# Patient Record
Sex: Male | Born: 1990 | Race: White | Hispanic: No | Marital: Single | Smoking: Current some day smoker
Health system: Southern US, Community
[De-identification: ages and names within clinical notes are randomized; demographics above are authoritative.]

## PROBLEM LIST (undated history)

## (undated) DIAGNOSIS — F1910 Other psychoactive substance abuse, uncomplicated: Secondary | ICD-10-CM

## (undated) DIAGNOSIS — F909 Attention-deficit hyperactivity disorder, unspecified type: Secondary | ICD-10-CM

## (undated) HISTORY — PX: KNEE SURGERY: SHX244

## (undated) HISTORY — DX: Attention-deficit hyperactivity disorder, unspecified type: F90.9

## (undated) HISTORY — PX: TYMPANOSTOMY TUBE PLACEMENT: SHX32

## (undated) HISTORY — PX: FEMUR FRACTURE SURGERY: SHX633

---
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
## 2011-05-09 ENCOUNTER — Ambulatory Visit: Payer: Self-pay | Admitting: Family Medicine

## 2012-05-12 ENCOUNTER — Ambulatory Visit: Payer: Self-pay | Admitting: Internal Medicine

## 2013-12-05 ENCOUNTER — Ambulatory Visit: Payer: Self-pay | Admitting: Unknown Physician Specialty

## 2014-06-01 ENCOUNTER — Emergency Department: Payer: Self-pay | Admitting: Emergency Medicine

## 2014-06-01 LAB — CBC
HCT: 41.9 % (ref 40.0–52.0)
HGB: 13.9 g/dL (ref 13.0–18.0)
MCH: 30.9 pg (ref 26.0–34.0)
MCHC: 33.3 g/dL (ref 32.0–36.0)
MCV: 93 fL (ref 80–100)
Platelet: 280 10*3/uL (ref 150–440)
RBC: 4.52 10*6/uL (ref 4.40–5.90)
RDW: 12.4 % (ref 11.5–14.5)
WBC: 8.4 10*3/uL (ref 3.8–10.6)

## 2014-06-01 LAB — COMPREHENSIVE METABOLIC PANEL
Albumin: 4.5 g/dL (ref 3.4–5.0)
Alkaline Phosphatase: 48 U/L
Anion Gap: 13 (ref 7–16)
BILIRUBIN TOTAL: 0.5 mg/dL (ref 0.2–1.0)
BUN: 17 mg/dL (ref 7–18)
CALCIUM: 9.3 mg/dL (ref 8.5–10.1)
CHLORIDE: 104 mmol/L (ref 98–107)
CO2: 23 mmol/L (ref 21–32)
CREATININE: 1.04 mg/dL (ref 0.60–1.30)
EGFR (African American): >60
GLUCOSE: 104 mg/dL — AB (ref 65–99)
OSMOLALITY: 281 (ref 275–301)
POTASSIUM: 3.4 mmol/L — AB (ref 3.5–5.1)
SGOT(AST): 17 U/L (ref 15–37)
SGPT (ALT): 22 U/L
Sodium: 140 mmol/L (ref 136–145)
Total Protein: 8 g/dL (ref 6.4–8.2)

## 2014-06-01 LAB — TROPONIN I

## 2014-06-03 ENCOUNTER — Ambulatory Visit: Payer: Self-pay | Admitting: Orthopedic Surgery

## 2014-09-01 ENCOUNTER — Ambulatory Visit: Payer: Self-pay

## 2014-09-01 LAB — GC/CHLAMYDIA PROBE AMP

## 2014-09-27 ENCOUNTER — Emergency Department: Payer: Self-pay | Admitting: Emergency Medicine

## 2014-09-27 LAB — DRUG SCREEN, URINE

## 2014-09-27 LAB — CBC
HCT: 45.4 % (ref 40.0–52.0)
HGB: 15.6 g/dL (ref 13.0–18.0)
MCH: 32 pg (ref 26.0–34.0)
MCHC: 34.5 g/dL (ref 32.0–36.0)
MCV: 93 fL (ref 80–100)
Platelet: 241 10*3/uL (ref 150–440)
RBC: 4.89 10*6/uL (ref 4.40–5.90)
RDW: 12.7 % (ref 11.5–14.5)
WBC: 7.8 10*3/uL (ref 3.8–10.6)

## 2014-09-27 LAB — COMPREHENSIVE METABOLIC PANEL
ALT: 28 U/L
AST: 33 U/L (ref 15–37)
Albumin: 4.8 g/dL (ref 3.4–5.0)
Alkaline Phosphatase: 52 U/L
Anion Gap: 12 (ref 7–16)
BILIRUBIN TOTAL: 0.7 mg/dL (ref 0.2–1.0)
BUN: 17 mg/dL (ref 7–18)
CHLORIDE: 99 mmol/L (ref 98–107)
CO2: 25 mmol/L (ref 21–32)
CREATININE: 1.1 mg/dL (ref 0.60–1.30)
Calcium, Total: 9.7 mg/dL (ref 8.5–10.1)
Glucose: 117 mg/dL — ABNORMAL HIGH (ref 65–99)
Osmolality: 275 (ref 275–301)
Potassium: 3 mmol/L — ABNORMAL LOW (ref 3.5–5.1)
Sodium: 136 mmol/L (ref 136–145)
Total Protein: 8.5 g/dL — ABNORMAL HIGH (ref 6.4–8.2)

## 2014-09-27 LAB — URINALYSIS, COMPLETE
BACTERIA: NONE SEEN
Bilirubin,UR: NEGATIVE
Blood: NEGATIVE
GLUCOSE, UR: NEGATIVE mg/dL (ref 0–75)
KETONE: NEGATIVE
Leukocyte Esterase: NEGATIVE
Nitrite: NEGATIVE
PROTEIN: NEGATIVE
Ph: 8 (ref 4.5–8.0)
RBC,UR: <1 /HPF (ref 0–5)
SPECIFIC GRAVITY: 1.001 (ref 1.003–1.030)
SQUAMOUS EPITHELIAL: NONE SEEN

## 2014-09-27 LAB — ETHANOL: Ethanol: <3 mg/dL

## 2014-09-27 LAB — SALICYLATE LEVEL

## 2014-09-27 LAB — ACETAMINOPHEN LEVEL: Acetaminophen: <2 ug/mL

## 2015-02-27 ENCOUNTER — Encounter: Payer: Self-pay | Admitting: Family Medicine

## 2015-02-27 ENCOUNTER — Ambulatory Visit (INDEPENDENT_AMBULATORY_CARE_PROVIDER_SITE_OTHER): Payer: BLUE CROSS/BLUE SHIELD | Admitting: Family Medicine

## 2015-02-27 VITALS — BP 120/90 | HR 64 | Ht 74.0 in | Wt 208.0 lb

## 2015-02-27 DIAGNOSIS — S060X0D Concussion without loss of consciousness, subsequent encounter: Secondary | ICD-10-CM

## 2015-02-27 DIAGNOSIS — S40812A Abrasion of left upper arm, initial encounter: Secondary | ICD-10-CM

## 2015-02-27 DIAGNOSIS — L259 Unspecified contact dermatitis, unspecified cause: Secondary | ICD-10-CM | POA: Diagnosis not present

## 2015-02-27 MED ORDER — PREDNISONE 10 MG PO TABS: 10.0000 mg | ORAL_TABLET | Freq: Every day | ORAL | Status: DC
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
## 2015-03-25 ENCOUNTER — Other Ambulatory Visit: Payer: Self-pay | Admitting: Family Medicine

## 2015-03-25 DIAGNOSIS — F32A Depression, unspecified: Secondary | ICD-10-CM

## 2015-03-25 DIAGNOSIS — F329 Major depressive disorder, single episode, unspecified: Secondary | ICD-10-CM

## 2015-04-21 ENCOUNTER — Other Ambulatory Visit: Payer: Self-pay

## 2016-03-07 ENCOUNTER — Ambulatory Visit: Payer: BLUE CROSS/BLUE SHIELD | Attending: Orthopedic Surgery | Admitting: Occupational Therapy

## 2016-03-07 DIAGNOSIS — X58XXXA Exposure to other specified factors, initial encounter: Secondary | ICD-10-CM | POA: Insufficient documentation

## 2016-03-07 DIAGNOSIS — S62609A Fracture of unspecified phalanx of unspecified finger, initial encounter for closed fracture: Secondary | ICD-10-CM | POA: Diagnosis present

## 2016-03-07 NOTE — Therapy (Signed)
[place] [facility] [address] [place], [place], [address] Phone: [phone]   Fax:  [phone]  Occupational Therapy Treatment  Patient Details  Name: Roger Daugherty MRN: [id number] Date of Birth: May 26, 1991 Referring Provider: Floyce StakesGaines  Encounter Date: 03/07/2016      OT End of Session - 03/07/16 0852    Visit Number 1   Number of Visits 1   Date for OT Re-Evaluation 03/07/16   OT Start Time 0731   OT Stop Time 0802   OT Time Calculation (min) 31 min   Activity Tolerance Patient tolerated treatment well   Behavior During Therapy [facility] for tasks assessed/performed      Past Medical History  Diagnosis Date  . Adult ADHD     Past Surgical History  Procedure Laterality Date  . Knee surgery Right     Meniscal tear  . Tympanostomy tube placement      There were no vitals filed for this visit.      Subjective Assessment - 03/07/16 0800    Subjective  I fracture my base of R 5th MC- probably did it about 2 wks before talking to the PA - he send me over here for splint - I am running now - can't do crossfit    Patient Stated Goals To get splint on this hand for fracture to heal   Currently in Pain? 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 -  [employer], run long distance trails,  golf, obstacle course races      See pt instructions                    OT Education - 03/07/16 0808    Education provided Yes   Education Details splint wearing     Person(s) Educated Patient   Methods Explanation;Demonstration;Tactile cues;Verbal cues   Comprehension Verbal cues required;Returned demonstration;Verbalized understanding             OT Long Term Goals - 03/07/16 0855    OT LONG TERM GOAL #1   Title Pt verbalize and demo understanding of wearing , donning and doffing of splint    Baseline Showed in clinici   Status Achieved               Plan - 03/07/16 0853    Clinical Impression Statement Pt report about 3wks out from R 5th MC base fx - per pt MD wants him in ulnar gutter splint- fabricated forearm base ulnar gutter splint - including 4th and 5th - but PIP's of 4th free - and MC's flex at 80 - pt report understanding  wearing of splint - appear to have good fit - pt to wear as per MD order - phone if any  redness or pressure areas thatr do not disappear in 45 min    Rehab Potential Excellent  OT Frequency One time visit   OT Treatment/Interventions Patient/family education;Splinting   Plan to phone if issues with splnt - other wise 1 x visit   OT Home Exercise Plan see pt instruction    Consulted and Agree with Plan of Care Patient      Patient will benefit from skilled therapeutic intervention in order to improve the following deficits and impairments:  Impaired flexibility, Pain  Visit Diagnosis: Finger fracture, right, closed, initial encounter - Plan: Ot plan of care cert/re-cert    Problem List There are no active problems to display for this patient.   Oletta CohnuPreez, Lydell Moga OTR/L,CLT  03/07/2016, 8:58 AM  [place] [facility] [address] [place], [place], [address] Phone: [phone]   Fax:  [phone]  Name: Roger Daugherty MRN: [id number] Date of Birth: 02-23-1991

## 2016-03-07 NOTE — Patient Instructions (Signed)
Custom forearm base ulnar gutter splint - including 5th and 4th  MC's at 80 degrees flexion  PIP 4th free  To keep splint on as per MD order - and do ed on redness or pressure areas   To call

## 2016-03-24 ENCOUNTER — Emergency Department
Admission: EM | Admit: 2016-03-24 | Discharge: 2016-03-24 | Disposition: A | Discharge status: Home / Self Care | Payer: BLUE CROSS/BLUE SHIELD | Attending: Emergency Medicine | Admitting: Emergency Medicine

## 2016-03-24 ENCOUNTER — Encounter: Payer: Self-pay | Admitting: Emergency Medicine

## 2016-03-24 DIAGNOSIS — F909 Attention-deficit hyperactivity disorder, unspecified type: Secondary | ICD-10-CM | POA: Diagnosis not present

## 2016-03-24 DIAGNOSIS — F1910 Other psychoactive substance abuse, uncomplicated: Secondary | ICD-10-CM

## 2016-03-24 DIAGNOSIS — Z87891 Personal history of nicotine dependence: Secondary | ICD-10-CM | POA: Diagnosis not present

## 2016-03-24 DIAGNOSIS — Z79899 Other long term (current) drug therapy: Secondary | ICD-10-CM | POA: Diagnosis not present

## 2016-03-24 DIAGNOSIS — F1290 Cannabis use, unspecified, uncomplicated: Secondary | ICD-10-CM | POA: Diagnosis not present

## 2016-03-24 DIAGNOSIS — R45850 Homicidal ideations: Secondary | ICD-10-CM | POA: Diagnosis present

## 2016-03-24 DIAGNOSIS — F1990 Other psychoactive substance use, unspecified, uncomplicated: Secondary | ICD-10-CM | POA: Diagnosis not present

## 2016-03-24 LAB — CBC
HEMATOCRIT: 42.8 % (ref 40.0–52.0)
Hemoglobin: 14.8 g/dL (ref 13.0–18.0)
MCH: 32.5 pg (ref 26.0–34.0)
MCHC: 34.6 g/dL (ref 32.0–36.0)
MCV: 93.9 fL (ref 80.0–100.0)
PLATELETS: 220 10*3/uL (ref 150–440)
RBC: 4.56 MIL/uL (ref 4.40–5.90)
RDW: 13 % (ref 11.5–14.5)
WBC: 6.9 10*3/uL (ref 3.8–10.6)

## 2016-03-24 LAB — COMPREHENSIVE METABOLIC PANEL
ALT: 39 U/L (ref 17–63)
AST: 41 U/L (ref 15–41)
Albumin: 5.1 g/dL — ABNORMAL HIGH (ref 3.5–5.0)
Alkaline Phosphatase: 50 U/L (ref 38–126)
Anion gap: 7 (ref 5–15)
BUN: 19 mg/dL (ref 6–20)
CHLORIDE: 106 mmol/L (ref 101–111)
CO2: 26 mmol/L (ref 22–32)
CREATININE: 0.88 mg/dL (ref 0.61–1.24)
Calcium: 9.8 mg/dL (ref 8.9–10.3)
Glucose, Bld: 104 mg/dL — ABNORMAL HIGH (ref 65–99)
Potassium: 4.4 mmol/L (ref 3.5–5.1)
Sodium: 139 mmol/L (ref 135–145)
TOTAL PROTEIN: 7.8 g/dL (ref 6.5–8.1)
Total Bilirubin: 0.6 mg/dL (ref 0.3–1.2)

## 2016-03-24 LAB — ETHANOL

## 2016-03-24 LAB — SALICYLATE LEVEL

## 2016-03-24 LAB — ACETAMINOPHEN LEVEL

## 2016-03-24 MED ORDER — LORAZEPAM 2 MG PO TABS: 2.0000 mg | ORAL_TABLET | Freq: Once | ORAL | Status: DC

## 2016-03-24 NOTE — ED Notes (Signed)
Brought over from out patient psy eval, pt appears to have some type of impairment,  Took 5 klonopins today , ETOH nightly, Psy Dr.Patel  " possible bipolar disorder" ,  Denies SI
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
## 2016-03-26 ENCOUNTER — Emergency Department
Admission: EM | Admit: 2016-03-26 | Discharge: 2016-03-26 | Disposition: A | Discharge status: Home / Self Care | Payer: BLUE CROSS/BLUE SHIELD | Attending: Emergency Medicine | Admitting: Emergency Medicine

## 2016-03-26 DIAGNOSIS — Z79899 Other long term (current) drug therapy: Secondary | ICD-10-CM | POA: Insufficient documentation

## 2016-03-26 DIAGNOSIS — F1994 Other psychoactive substance use, unspecified with psychoactive substance-induced mood disorder: Secondary | ICD-10-CM | POA: Diagnosis not present

## 2016-03-26 DIAGNOSIS — Z9622 Myringotomy tube(s) status: Secondary | ICD-10-CM | POA: Insufficient documentation

## 2016-03-26 DIAGNOSIS — F900 Attention-deficit hyperactivity disorder, predominantly inattentive type: Secondary | ICD-10-CM | POA: Insufficient documentation

## 2016-03-26 DIAGNOSIS — F1320 Sedative, hypnotic or anxiolytic dependence, uncomplicated: Secondary | ICD-10-CM

## 2016-03-26 DIAGNOSIS — F1910 Other psychoactive substance abuse, uncomplicated: Secondary | ICD-10-CM | POA: Insufficient documentation

## 2016-03-26 DIAGNOSIS — F1310 Sedative, hypnotic or anxiolytic abuse, uncomplicated: Secondary | ICD-10-CM

## 2016-03-26 DIAGNOSIS — F1290 Cannabis use, unspecified, uncomplicated: Secondary | ICD-10-CM | POA: Diagnosis not present

## 2016-03-26 DIAGNOSIS — Z87891 Personal history of nicotine dependence: Secondary | ICD-10-CM | POA: Diagnosis not present

## 2016-03-26 LAB — URINE DRUG SCREEN, QUALITATIVE (ARMC ONLY)
AMPHETAMINES, UR SCREEN: NOT DETECTED
Barbiturates, Ur Screen: NOT DETECTED
Benzodiazepine, Ur Scrn: POSITIVE — AB
COCAINE METABOLITE, UR ~~LOC~~: NOT DETECTED
Cannabinoid 50 Ng, Ur ~~LOC~~: POSITIVE — AB
MDMA (ECSTASY) UR SCREEN: NOT DETECTED
Methadone Scn, Ur: NOT DETECTED
Opiate, Ur Screen: NOT DETECTED
PHENCYCLIDINE (PCP) UR S: NOT DETECTED
TRICYCLIC, UR SCREEN: NOT DETECTED

## 2016-03-26 LAB — COMPREHENSIVE METABOLIC PANEL
ALT: 31 U/L (ref 17–63)
ANION GAP: 5 (ref 5–15)
AST: 32 U/L (ref 15–41)
Albumin: 4.5 g/dL (ref 3.5–5.0)
Alkaline Phosphatase: 35 U/L — ABNORMAL LOW (ref 38–126)
BILIRUBIN TOTAL: 0.8 mg/dL (ref 0.3–1.2)
BUN: 18 mg/dL (ref 6–20)
CHLORIDE: 105 mmol/L (ref 101–111)
CO2: 30 mmol/L (ref 22–32)
Calcium: 9.1 mg/dL (ref 8.9–10.3)
Creatinine, Ser: 1.02 mg/dL (ref 0.61–1.24)
Glucose, Bld: 52 mg/dL — ABNORMAL LOW (ref 65–99)
POTASSIUM: 3.9 mmol/L (ref 3.5–5.1)
Sodium: 140 mmol/L (ref 135–145)
TOTAL PROTEIN: 7 g/dL (ref 6.5–8.1)

## 2016-03-26 LAB — URINALYSIS COMPLETE WITH MICROSCOPIC (ARMC ONLY)
Bacteria, UA: NONE SEEN
Bilirubin Urine: NEGATIVE
GLUCOSE, UA: NEGATIVE mg/dL
Hgb urine dipstick: NEGATIVE
KETONES UR: NEGATIVE mg/dL
Leukocytes, UA: NEGATIVE
NITRITE: NEGATIVE
PROTEIN: NEGATIVE mg/dL
RBC / HPF: NONE SEEN RBC/hpf (ref 0–5)
SPECIFIC GRAVITY, URINE: 1.017 (ref 1.005–1.030)
Squamous Epithelial / LPF: NONE SEEN
pH: 6 (ref 5.0–8.0)

## 2016-03-26 LAB — SALICYLATE LEVEL

## 2016-03-26 LAB — CBC WITH DIFFERENTIAL/PLATELET
BASOS ABS: 0 10*3/uL (ref 0–0.1)
Basophils Relative: 1 %
EOS PCT: 3 %
Eosinophils Absolute: 0.2 10*3/uL (ref 0–0.7)
HEMATOCRIT: 40.2 % (ref 40.0–52.0)
Hemoglobin: 13.8 g/dL (ref 13.0–18.0)
LYMPHS PCT: 41 %
Lymphs Abs: 3 10*3/uL (ref 1.0–3.6)
MCH: 32.3 pg (ref 26.0–34.0)
MCHC: 34.3 g/dL (ref 32.0–36.0)
MCV: 94.1 fL (ref 80.0–100.0)
MONO ABS: 0.6 10*3/uL (ref 0.2–1.0)
MONOS PCT: 8 %
NEUTROS ABS: 3.5 10*3/uL (ref 1.4–6.5)
Neutrophils Relative %: 47 %
PLATELETS: 196 10*3/uL (ref 150–440)
RBC: 4.27 MIL/uL — ABNORMAL LOW (ref 4.40–5.90)
RDW: 13.2 % (ref 11.5–14.5)
WBC: 7.4 10*3/uL (ref 3.8–10.6)

## 2016-03-26 LAB — GLUCOSE, CAPILLARY: GLUCOSE-CAPILLARY: 102 mg/dL — AB (ref 65–99)

## 2016-03-26 LAB — ETHANOL: ALCOHOL ETHYL (B): 47 mg/dL — AB (ref <5)

## 2016-03-26 LAB — ACETAMINOPHEN LEVEL: Acetaminophen (Tylenol), Serum: <10 ug/mL — ABNORMAL LOW (ref 10–30)

## 2016-03-26 MED ORDER — LORAZEPAM 2 MG PO TABS: 0.0000 mg | ORAL_TABLET | Freq: Two times a day (BID) | ORAL | Status: DC

## 2016-03-26 MED ORDER — THIAMINE HCL 100 MG/ML IJ SOLN: 100.0000 mg | Freq: Every day | INTRAMUSCULAR | Status: DC

## 2016-03-26 MED ORDER — LORAZEPAM 1 MG PO TABS
ORAL_TABLET | ORAL | Status: AC
  Administered 2016-03-26: 2 mg via ORAL
  Filled 2016-03-26: qty 1

## 2016-03-26 MED ORDER — VITAMIN B-1 100 MG PO TABS
100.0000 mg | ORAL_TABLET | Freq: Every day | ORAL | Status: DC
  Administered 2016-03-26: 100 mg via ORAL
  Filled 2016-03-26: qty 1

## 2016-03-26 MED ORDER — LORAZEPAM 2 MG PO TABS
0.0000 mg | ORAL_TABLET | Freq: Four times a day (QID) | ORAL | Status: DC
  Administered 2016-03-26: 2 mg via ORAL
  Filled 2016-03-26: qty 1

## 2016-03-26 NOTE — ED Notes (Signed)
Lights dimmed for comfort and bed adjusted for comfort.

## 2016-03-26 NOTE — ED Notes (Signed)
Ford with TTS informed this rn that "our psychiatrist is recommending inpatient treatment". Dr. York Ceriseforbach notified.

## 2016-03-26 NOTE — ED Notes (Signed)
Patient currently in dayroom speaking with nursing staff. Patient has asked several times when he will be able to be discharged, and nurse informed patient of the discharge process. No signs of distress noted at this time. Maintained on 15 minute checks and observation by security camera for safety.

## 2016-03-26 NOTE — Consult Note (Signed)
[facility] Psychiatry Consult   Reason for Consult:  Consult for 25 year old man who came voluntarily to the emergency room requesting treatment for benzodiazepine abuse Referring Physician:  Jimmye Norman Patient Identification: Roger Daugherty MRN:  [id number] Principal Diagnosis: Substance induced mood disorder [facility]) Diagnosis:   Patient Active Problem List   Diagnosis Date Noted  . Benzodiazepine dependence ([place]) [F13.20] 03/26/2016  . Substance induced mood disorder [facility]) [F19.94] 03/26/2016    Total Time spent with patient: 1 hour  Subjective:   Roger Daugherty is a 25 y.o. male patient admitted with "I just wanted some help with my benzodiazepine abuse".  HPI:  Patient evaluated. Chart reviewed. Patient presented to the ER last night because of benzodiazepine abuse. He tells me that he takes about 14 mg of clonazepam a day and that he will take Xanax 2 if it is available. He also says that he was drinking a few beers yesterday. He felt more sedated than usual and that what he says was why he was worried enough to come to the emergency room. Patient denies any suicidal thoughts or intent. Denies homicidal ideation. Denies any psychotic symptoms. He is not currently involved in any kind of outpatient mental health treatment. He says he continues to function reasonably well at work.  Social history: Lives with his parents. Works as a Physiological scientist.  Medical history: Has irritable bowel syndrome no other active medical problems.  Substance abuse history: Long history of abusing benzodiazepines. Has never been to any kind of substance abuse treatment program. He's been able to stop on his own for a couple weeks at most but then gets too anxious and goes back to abusing. Denies that he is abusing any other drugs.  Past Psychiatric History: No history of psychiatric hospitalization. No history of suicide attempts. Not prescribed any medicine for the anxiety despises drugs off the  street. No hallucinations no psychosis. No history of harm to others.  Risk to Self: Suicidal Ideation: No Suicidal Intent: No Is patient at risk for suicide?: No Suicidal Plan?: No Access to Means: No What has been your use of drugs/alcohol within the last 12 months?: Pt is abusing benzodiazepines, marijuana and alcohol How many times?: 0 Other Self Harm Risks: None Triggers for Past Attempts: None known Intentional Self Injurious Behavior: None Risk to Others: Homicidal Ideation: No Thoughts of Harm to Others: No Current Homicidal Intent: No Current Homicidal Plan: No Access to Homicidal Means: No Identified Victim: None History of harm to others?: No Assessment of Violence: None Noted Violent Behavior Description: Pt denies history of violence Does patient have access to weapons?: Yes (Comment) (Pt reports he has several guns) Criminal Charges Pending?: Yes Describe Pending Criminal Charges: DWI Does patient have a court date: Yes Court Date:  (Unknown) Prior Inpatient Therapy: Prior Inpatient Therapy: No Prior Therapy Dates: NA Prior Therapy Facilty/Provider(s): NA Reason for Treatment: NA Prior Outpatient Therapy: Prior Outpatient Therapy: Yes Prior Therapy Dates: 2017 Prior Therapy Facilty/Provider(s): Dr. Gwen Her and Caffie Damme Reason for Treatment: anxiety, PTSD Does patient have an ACCT team?: No Does patient have Intensive In-House Services?  : No Does patient have Daugherty services? : No Does patient have P4CC services?: No  Past Medical History:  Past Medical History  Diagnosis Date  . Adult ADHD     Past Surgical History  Procedure Laterality Date  . Knee surgery Right     Meniscal tear  . Tympanostomy tube placement     Family History: No family history  on file. Family Psychiatric  History: Patient describes his mother as "crazy as hell" and also says there is substance abuse in his family. He can't be any more clear than that about his  mother. Social History:  History  Alcohol Use  . 1.2 oz/week  . 2 Cans of beer per week    Comment: Daily     History  Drug Use  . Yes  . Special: Marijuana    Comment: Benzo    Social History   Social History  . Marital Status: Single    Spouse Name: N/A  . Number of Children: N/A  . Years of Education: N/A   Social History Main Topics  . Smoking status: Former Research scientist (life sciences)  . Smokeless tobacco: Not on file  . Alcohol Use: 1.2 oz/week    2 Cans of beer per week     Comment: Daily  . Drug Use: Yes    Special: Marijuana     Comment: Benzo  . Sexual Activity: Yes    Birth Control/ Protection: None   Other Topics Concern  . Not on file   Social History Narrative   Additional Social History:    Allergies:  No Known Allergies  Labs:  Results for orders placed or performed during the hospital encounter of 03/26/16 (from the past 48 hour(s))  CBC with Differential/Platelet     Status: Abnormal   Collection Time: 03/26/16  3:30 AM  Result Value Ref Range   WBC 7.4 3.8 - 10.6 K/uL   RBC 4.27 (L) 4.40 - 5.90 MIL/uL   Hemoglobin 13.8 13.0 - 18.0 g/dL   HCT 40.2 40.0 - 52.0 %   MCV 94.1 80.0 - 100.0 fL   MCH 32.3 26.0 - 34.0 pg   MCHC 34.3 32.0 - 36.0 g/dL   RDW 13.2 11.5 - 14.5 %   Platelets 196 150 - 440 K/uL   Neutrophils Relative % 47 %   Neutro Abs 3.5 1.4 - 6.5 K/uL   Lymphocytes Relative 41 %   Lymphs Abs 3.0 1.0 - 3.6 K/uL   Monocytes Relative 8 %   Monocytes Absolute 0.6 0.2 - 1.0 K/uL   Eosinophils Relative 3 %   Eosinophils Absolute 0.2 0 - 0.7 K/uL   Basophils Relative 1 %   Basophils Absolute 0.0 0 - 0.1 K/uL  Comprehensive metabolic panel     Status: Abnormal   Collection Time: 03/26/16  3:30 AM  Result Value Ref Range   Sodium 140 135 - 145 mmol/L   Potassium 3.9 3.5 - 5.1 mmol/L   Chloride 105 101 - 111 mmol/L   CO2 30 22 - 32 mmol/L   Glucose, Bld 52 (L) 65 - 99 mg/dL   BUN 18 6 - 20 mg/dL   Creatinine, Ser 1.02 0.61 - 1.24 mg/dL   Calcium  9.1 8.9 - 10.3 mg/dL   Total Protein 7.0 6.5 - 8.1 g/dL   Albumin 4.5 3.5 - 5.0 g/dL   AST 32 15 - 41 U/L   ALT 31 17 - 63 U/L   Alkaline Phosphatase 35 (L) 38 - 126 U/L   Total Bilirubin 0.8 0.3 - 1.2 mg/dL   GFR calc non Af Amer >60 >60 mL/min   GFR calc Af Amer >60 >60 mL/min    Comment: (NOTE) The eGFR has been calculated using the CKD EPI equation. This calculation has not been validated in all clinical situations. eGFR's persistently <60 mL/min signify possible Chronic Kidney Disease.    Anion  gap 5 5 - 15  Ethanol     Status: Abnormal   Collection Time: 03/26/16  3:30 AM  Result Value Ref Range   Alcohol, Ethyl (B) 47 (H) <5 mg/dL    Comment:        LOWEST DETECTABLE LIMIT FOR SERUM ALCOHOL IS 5 mg/dL FOR MEDICAL PURPOSES ONLY   Salicylate level     Status: None   Collection Time: 03/26/16  3:30 AM  Result Value Ref Range   Salicylate Lvl <1.6 2.8 - 30.0 mg/dL  Acetaminophen level     Status: Abnormal   Collection Time: 03/26/16  3:30 AM  Result Value Ref Range   Acetaminophen (Tylenol), Serum <10 (L) 10 - 30 ug/mL    Comment:        THERAPEUTIC CONCENTRATIONS VARY SIGNIFICANTLY. A RANGE OF 10-30 ug/mL MAY BE AN EFFECTIVE CONCENTRATION FOR MANY PATIENTS. HOWEVER, SOME ARE BEST TREATED AT CONCENTRATIONS OUTSIDE THIS RANGE. ACETAMINOPHEN CONCENTRATIONS >150 ug/mL AT 4 HOURS AFTER INGESTION AND >50 ug/mL AT 12 HOURS AFTER INGESTION ARE OFTEN ASSOCIATED WITH TOXIC REACTIONS.   Glucose, capillary     Status: Abnormal   Collection Time: 03/26/16  4:54 AM  Result Value Ref Range   Glucose-Capillary 102 (H) 65 - 99 mg/dL  Urinalysis complete, with microscopic ([facility] only)     Status: Abnormal   Collection Time: 03/26/16  6:50 AM  Result Value Ref Range   Color, Urine YELLOW (A) YELLOW   APPearance CLEAR (A) CLEAR   Glucose, UA NEGATIVE NEGATIVE mg/dL   Bilirubin Urine NEGATIVE NEGATIVE   Ketones, ur NEGATIVE NEGATIVE mg/dL   Specific Gravity, Urine 1.017  1.005 - 1.030   Hgb urine dipstick NEGATIVE NEGATIVE   pH 6.0 5.0 - 8.0   Protein, ur NEGATIVE NEGATIVE mg/dL   Nitrite NEGATIVE NEGATIVE   Leukocytes, UA NEGATIVE NEGATIVE   RBC / HPF NONE SEEN 0 - 5 RBC/hpf   WBC, UA 0-5 0 - 5 WBC/hpf   Bacteria, UA NONE SEEN NONE SEEN   Squamous Epithelial / LPF NONE SEEN NONE SEEN   Mucous PRESENT   Urine Drug Screen, Qualitative ([facility] only)     Status: Abnormal   Collection Time: 03/26/16  6:50 AM  Result Value Ref Range   Tricyclic, Ur Screen NONE DETECTED NONE DETECTED   Amphetamines, Ur Screen NONE DETECTED NONE DETECTED   MDMA (Ecstasy)Ur Screen NONE DETECTED NONE DETECTED   Cocaine Metabolite,Ur [place] NONE DETECTED NONE DETECTED   Opiate, Ur Screen NONE DETECTED NONE DETECTED   Phencyclidine (PCP) Ur S NONE DETECTED NONE DETECTED   Cannabinoid 50 Ng, Ur [place] POSITIVE (A) NONE DETECTED   Barbiturates, Ur Screen NONE DETECTED NONE DETECTED   Benzodiazepine, Ur Scrn POSITIVE (A) NONE DETECTED   Methadone Scn, Ur NONE DETECTED NONE DETECTED    Comment: (NOTE) 109  Tricyclics, urine               Cutoff 1000 ng/mL 200  Amphetamines, urine             Cutoff 1000 ng/mL 300  MDMA (Ecstasy), urine           Cutoff 500 ng/mL 400  Cocaine Metabolite, urine       Cutoff 300 ng/mL 500  Opiate, urine                   Cutoff 300 ng/mL 600  Phencyclidine (PCP), urine      Cutoff 25 ng/mL 700  Cannabinoid, urine  Cutoff 50 ng/mL 800  Barbiturates, urine             Cutoff 200 ng/mL 900  Benzodiazepine, urine           Cutoff 200 ng/mL 1000 Methadone, urine                Cutoff 300 ng/mL 1100 1200 The urine drug screen provides only a preliminary, unconfirmed 1300 analytical test result and should not be used for non-medical 1400 purposes. Clinical consideration and professional judgment should 1500 be applied to any positive drug screen result due to possible 1600 interfering substances. A more specific alternate chemical method 1700 must  be used in order to obtain a confirmed analytical result.  1800 Gas chromato graphy / mass spectrometry (GC/MS) is the preferred 1900 confirmatory method.     Current Facility-Administered Medications  Medication Dose Route Frequency Provider Last Rate Last Dose  . LORazepam (ATIVAN) tablet 0-4 mg  0-4 mg Oral Q6H Hinda Kehr, MD   2 mg at 03/26/16 1053   Followed by  . [START ON 03/28/2016] LORazepam (ATIVAN) tablet 0-4 mg  0-4 mg Oral Q12H Hinda Kehr, MD      . thiamine (VITAMIN B-1) tablet 100 mg  100 mg Oral Daily Hinda Kehr, MD   100 mg at 03/26/16 1035   Or  . thiamine (B-1) injection 100 mg  100 mg Intravenous Daily Hinda Kehr, MD       Current Outpatient Prescriptions  Medication Sig Dispense Refill  . albuterol (PROVENTIL HFA;VENTOLIN HFA) 108 (90 Base) MCG/ACT inhaler Inhale 2 puffs into the lungs See admin instructions. Every 4-6 hours as needed    . buPROPion (WELLBUTRIN SR) 150 MG 12 hr tablet Take 150 mg by mouth 2 (two) times daily.    Marland Kitchen linaclotide (LINZESS) 290 MCG CAPS capsule Take 290 mcg by mouth daily before breakfast.    . mometasone (ELOCON) 0.1 % ointment Apply topically daily as needed.    . vortioxetine HBr (TRINTELLIX) 20 MG TABS Take 10 mg by mouth daily.      Musculoskeletal: Strength & Muscle Tone: within normal limits Gait & Station: normal Patient leans: N/A  Psychiatric Specialty Exam: Physical Exam  Nursing note and vitals reviewed. Constitutional: He appears well-developed and well-nourished.  HENT:  Head: Normocephalic and atraumatic.  Eyes: Conjunctivae are normal. Pupils are equal, round, and reactive to light.  Neck: Normal range of motion.  Cardiovascular: Regular rhythm and normal heart sounds.   Respiratory: Effort normal. No respiratory distress.  GI: Soft.  Musculoskeletal: Normal range of motion.  Neurological: He is alert.  Skin: Skin is warm and dry.  Psychiatric: His behavior is normal. Thought content normal. His mood  appears anxious. Cognition and memory are normal. He expresses impulsivity.    Review of Systems  Constitutional: Negative.   HENT: Negative.   Eyes: Negative.   Respiratory: Negative.   Cardiovascular: Negative.   Gastrointestinal: Negative.   Musculoskeletal: Negative.   Skin: Negative.   Neurological: Negative.   Psychiatric/Behavioral: Positive for memory loss and substance abuse. Negative for depression, suicidal ideas and hallucinations. The patient is nervous/anxious. The patient does not have insomnia.     Blood pressure 115/65, pulse 55, temperature 97.8 F (36.6 C), temperature source Oral, resp. rate 20, height _0  (1.905 m), weight 82.555 kg (182 lb), SpO2 100 %.Body mass index is 22.75 kg/(m^2).  General Appearance: Disheveled  Eye Contact:  Good  Speech:  Pressured  Volume:  Increased  Mood:  Anxious  Affect:  Congruent  Thought Process:  Goal Directed  Orientation:  Full (Time, Place, and Person)  Thought Content:  Logical  Suicidal Thoughts:  No  Homicidal Thoughts:  No  Memory:  Immediate;   Good Recent;   Good Remote;   Good  Judgement:  Fair  Insight:  Fair  Psychomotor Activity:  Restlessness  Concentration:  Concentration: Poor  Recall:  [place] of Knowledge:  Good  Language:  Good  Akathisia:  No  Handed:  Right  AIMS (if indicated):     Assets:  Armed forces logistics/support/administrative officer Housing Physical Health  ADL's:  Intact  Cognition:  Impaired,  Mild  Sleep:        Treatment Plan Summary: Daily contact with patient to assess and evaluate symptoms and progress in treatment, Medication management and Plan 25 year old man with benzodiazepine abuse. He is currently lucid without any psychosis. Vital signs are stable. He has subjective anxiety. Patient has reasonable insight. No indication for commitment. He is requesting release. Patient will be given information about referral to local substance abuse services and strongly encouraged to get in to see either  her primary care doctor or a physician at mental health to work on a gradual taper for his medicine. No prescriptions at this time. He can be released from the emergency room. Case reviewed with the ER physician.  Disposition: Patient does not meet criteria for psychiatric inpatient admission. Supportive therapy provided about ongoing stressors.  Alethia Berthold, MD 03/26/2016 1:39 PM

## 2016-03-26 NOTE — ED Notes (Signed)
Pt sleeping, resps unlabored.  

## 2016-03-26 NOTE — ED Notes (Signed)
Patient requested to take a shower. No signs of acute distress noted. Maintained on 15 minute checks and observation by security camera for safety.

## 2016-03-26 NOTE — ED Provider Notes (Signed)
-----------------------------------------   12:47 PM on 03/26/2016 -----------------------------------------  Patient evaluated and cleared by psychiatry for discharge. Patient here for addiction to Klonopin and Xanax with no suicidal ideation.  Nita Sicklearolina Lesa Vandall, MD 03/26/16 1247

## 2016-03-26 NOTE — ED Notes (Signed)
Patient asleep in room. No noted distress or abnormal behavior. Will continue 15 minute checks and observation by security cameras for safety. 

## 2016-03-26 NOTE — ED Notes (Signed)
Patient currently denies SI/HI/AVH and pain. Patient was tearful upon assessment talking about his grief concerning the death of his best friend, and that he was missing the fundraiser used to pay for the funeral expenses. He requests medication for detox from benzos saying that he wants to make sure he doesn't have a seizure. Patient is sweaty and tremulous upon assessment and also appears to be anxious. Nurse advised patient that his withdrawal symptoms would have to continue to be monitored. Patient is calm and cooperative, no signs of acute distress at this time. Maintained on 15 minute checks and observation by security camera for safety.

## 2016-03-26 NOTE — ED Notes (Signed)
ENVIRONMENTAL ASSESSMENT Potentially harmful objects out of patient reach: Yes Personal belongings secured: Yes Patient dressed in hospital provided attire only: Yes Plastic bags out of patient reach: Yes Patient care equipment (cords, cables, call bells, lines, and drains) shortened, removed, or accounted for: Yes Equipment and supplies removed from bottom of stretcher: Yes Potentially toxic materials out of patient reach: Yes Sharps container removed or out of patient reach: Yes  Patient currently in room sleeping. No signs of distress noted at this time. Maintained on 15 minute checks and observation by security camera for safety.  

## 2016-03-26 NOTE — ED Notes (Addendum)
Pt dressed out into burgandy scrubs by matt, rn. Pt up attempting to urinate at this time. Pt needs to provide urine sample [facility]

## 2016-03-26 NOTE — ED Notes (Signed)
Pt ambulatory to triage with slightly unsteady gait and assisted by this RN for safety. Pt reports he has taken at least "14mg  of benzos" today. Pt states I want to do whatever it takes to get clean. Pt also reports daily etoh of approx 2 to 5 beers a day. Pt was seen here 2 days ago but left before treatment completed. Pt states he left because it was taking to long. Pt speech slow and "thick" sounding. Pt noted to be bradycardic with heart rate of 38 in triage.

## 2016-03-26 NOTE — ED Notes (Signed)
Pt continues to atttempt to urinate. Multiple cups of water provided.

## 2016-03-26 NOTE — ED Notes (Signed)
Patient complains of headache, stomach ache, sweats, anxiety, and tremors rated a 7/10. Patient is currently in dayroom stating that "he just doesn't feel right". Nurse administered 2 mg of ativan. Will continue to monitor patient for worsening symptoms.

## 2016-03-26 NOTE — ED Notes (Signed)
Pt ringing call bell atpprox every 8-10 minutes for small frequent requests. Pt again questioning wait time. Pt has consumed approx 80-%of sandwich, of orange juice and 4 graham crackers. Pt updated again regarding wait time and md evaluation.

## 2016-03-26 NOTE — ED Notes (Signed)
Denies SI/HI/AVH and pain. All belongings returned to patient, patient belongings form signed. Patient returned home to family. Discharge instructions reviewed.

## 2016-03-26 NOTE — ED Notes (Signed)
Report to margaret, rn in bhu.

## 2016-03-26 NOTE — BH Assessment (Addendum)
Tele Assessment Note   Roger Daugherty is an 25 y.o. single male who presents unaccompanied to [facility] ED requesting treatment for substance abuse. Pt was assessed at [facility] yesterday but left because he did not want to wait. Pt reports he started using Xanax and Klonopin in Roger 2016 and has been using 14-16 mg on a daily basis. Pt reports he buys benzodiazepines off the street. He also reports drinking 2-6 cans of beer daily and smoking approximately one gram of marijuana daily. He reports withdrawal symptoms when he tried to stop using including anxiety, irritability, tremors and sweats. He denies any history of seizures. He reports he has lost approximately thirty pounds in the past three months. He states without benzodiazepines he has insomnia. According to the Pt within the past week, his best friend just died, he lost his car, he has a DWI, his parents have separated and he is having financial difficulties. Pt denies current suicidal ideation or any history of suicide attempts. Pt denies current homicidal ideation or history of violence. Pt states he has a history of punching walls when angry. He denies any history of psychotic symptoms.  Pt reports his parents have separated and he stays with each one of them or with his girlfriend. 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 He does not know when his court date for his DWI is scheduled. Pt states he is a gun enthusiast and has several firearms. Pt reports a family history of mental health and substance abuse problems.  Pt denies any history of inpatient mental health or substance abuse treatment. He states he is currently seeing a psychiatrist, Roger Daugherty and a therapist, Roger Daugherty. Pt says he is prescribed Trintillnex for depression.  Pt is dressed in hospital scrubs and appears sedated with soft, slurred speech. He is oriented x4. Eye contact is fair. Pt's mood is depressed and affect is  congruent with mood. Thought process is coherent and relevant. There is no indication Pt is currently responding to internal stimuli or experiencing delusional thought content. Pt states he wants substance abuse treatment but also expresses concern that he will miss work.   Diagnosis: Benzodiazepine Use Disorder; Substance-induced Mood Disorder   Past Medical History:  Past Medical History  Diagnosis Date  . Adult ADHD     Past Surgical History  Procedure Laterality Date  . Knee surgery Right     Meniscal tear  . Tympanostomy tube placement      Family History: No family history on file.  Social History:  reports that he has quit smoking. He does not have any smokeless tobacco history on file. He reports that he drinks about 1.2 oz of alcohol per week. He reports that he uses illicit drugs (Marijuana).  Additional Social History:  Alcohol / Drug Use Pain Medications: Pt denies Prescriptions: Trintellix Over the Counter: Pt denies History of alcohol / drug use?: Yes Longest period of sobriety (when/how long): unknown Negative Consequences of Use: Financial, Armed forces operational officer, Work / Programmer, multimedia, Personal relationships Withdrawal Symptoms: Sweats, Tremors, Irritability, Agitation Substance #1 Name of Substance 1: Xanax and Klonopin 1 - Age of First Use: 24 1 - Amount (size/oz): 14-16 mg 1 - Frequency: Daily 1 - Duration: Ongoing 1 - Last Use / Amount: 03/25/16, 14 mg Substance #2 Name of Substance 2: alcohol 2 - Age of First Use: 17 2 - Amount (size/oz): 2-6 beers 2 - Frequency: Daily 2 - Duration: Ongoing 2 - Last  Use / Amount: 03/25/16 Substance #3 Name of Substance 3: Marijuana 3 - Age of First Use: 17 3 - Amount (size/oz): One gram 3 - Frequency: Daily 3 - Duration: Ongoing 3 - Last Use / Amount: 03/25/16, one gram  CIWA: CIWA-Ar BP: 105/60 mmHg Pulse Rate: (!) 56 COWS:    PATIENT STRENGTHS: (choose at least two) Ability for insight Average or above average  intelligence Capable of independent living Metallurgist fund of knowledge Physical Health Supportive family/friends Work skills  Allergies: No Known Allergies  Home Medications:  (Not in a hospital admission)  OB/GYN Status:  No LMP for male patient.  General Assessment Data Location of Assessment: [facility] ED TTS Assessment: In system Is this a Tele or Face-to-Face Assessment?: Tele Assessment Is this an Initial Assessment or a Re-assessment for this encounter?: Initial Assessment Marital status: Single Maiden name: NA Is patient pregnant?: No Pregnancy Status: No Living Arrangements: Parent Can pt return to current living arrangement?: Yes Admission Status: Voluntary Is patient capable of signing voluntary admission?: Yes Referral Source: Self/Family/Friend Insurance type: BCBS     Crisis Care Plan Living Arrangements: Parent Legal Guardian: Other: (Self) Name of Psychiatrist: Dr. Nelly Daugherty Name of Therapist: [name]  Education Status Is patient currently in school?: No Current Grade: NA Highest grade of school patient has completed: 12 Name of school: NA Contact person: NA  Risk to self with the past 6 months Suicidal Ideation: No Has patient been a risk to self within the past 6 months prior to admission? : No Suicidal Intent: No Has patient had any suicidal intent within the past 6 months prior to admission? : No Is patient at risk for suicide?: No Suicidal Plan?: No Has patient had any suicidal plan within the past 6 months prior to admission? : No Access to Means: No What has been your use of drugs/alcohol within the last 12 months?: Pt is abusing benzodiazepines, marijuana and alcohol Previous Attempts/Gestures: No How many times?: 0 Other Self Harm Risks: None Triggers for Past Attempts: None known Intentional Self Injurious Behavior: None Family Suicide History: No Recent stressful life event(s): Conflict  (Comment), Divorce, Loss (Comment) (Conflct with girlfriend and life issues) Persecutory voices/beliefs?: No Depression: Yes Depression Symptoms: Despondent, Insomnia, Tearfulness, Fatigue, Isolating, Guilt, Feeling worthless/self pity, Feeling angry/irritable Substance abuse history and/or treatment for substance abuse?: Yes Suicide prevention information given to non-admitted patients: Not applicable  Risk to Others within the past 6 months Homicidal Ideation: No Does patient have any lifetime risk of violence toward others beyond the six months prior to admission? : No Thoughts of Harm to Others: No Current Homicidal Intent: No Current Homicidal Plan: No Access to Homicidal Means: No Identified Victim: None History of harm to others?: No Assessment of Violence: None Noted Violent Behavior Description: Pt denies history of violence Does patient have access to weapons?: Yes (Comment) (Pt reports he has several guns) Criminal Charges Pending?: Yes Describe Pending Criminal Charges: DWI Does patient have a court date: Yes Court Date:  (Unknown) Is patient on probation?: No  Psychosis Hallucinations: None noted Delusions: None noted  Mental Status Report Appearance/Hygiene: In scrubs Eye Contact: Fair Motor Activity: Unremarkable Speech: Logical/coherent, Slurred, Slow Level of Consciousness: Sedated Mood: Depressed Affect: Depressed, Sad Anxiety Level: Minimal Thought Processes: Coherent, Relevant Judgement: Unimpaired Orientation: Person, Place, Time, Situation, Appropriate for developmental age Obsessive Compulsive Thoughts/Behaviors: None  Cognitive Functioning Concentration: Normal Memory: Recent Intact, Remote Intact IQ: Average Insight: Fair Impulse Control: Fair Appetite: Poor  Weight Loss: 30 (Pt reports thirty pound weight loss in three months) Weight Gain: 0 Sleep: Decreased Total Hours of Sleep: 4 Vegetative Symptoms: None  ADLScreening [facility] Assessment  Services) Patient's cognitive ability adequate to safely complete daily activities?: Yes Patient able to express need for assistance with ADLs?: Yes Independently performs ADLs?: Yes (appropriate for developmental age)  Prior Inpatient Therapy Prior Inpatient Therapy: No Prior Therapy Dates: NA Prior Therapy Facilty/Provider(s): NA Reason for Treatment: NA  Prior Outpatient Therapy Prior Outpatient Therapy: Yes Prior Therapy Dates: 2017 Prior Therapy Facilty/Provider(s): Dr. Nelly RoutNeyemeyer and Roger ArthursGary Baily Reason for Treatment: anxiety, PTSD Does patient have an ACCT team?: No Does patient have Intensive In-House Services?  : No Does patient have [facility] services? : No Does patient have P4CC services?: No  ADL Screening (condition at time of admission) Patient's cognitive ability adequate to safely complete daily activities?: Yes Is the patient deaf or have difficulty hearing?: No Does the patient have difficulty seeing, even when wearing glasses/contacts?: No Does the patient have difficulty concentrating, remembering, or making decisions?: No Patient able to express need for assistance with ADLs?: Yes Does the patient have difficulty dressing or bathing?: No Independently performs ADLs?: Yes (appropriate for developmental age) Does the patient have difficulty walking or climbing stairs?: No Weakness of Legs: None Weakness of Arms/Hands: None       Abuse/Neglect Assessment (Assessment to be complete while patient is alone) Physical Abuse: Denies Verbal Abuse: Denies Sexual Abuse: Denies Exploitation of patient/patient's resources: Denies Self-Neglect: Denies     Merchant navy officerAdvance Directives (For Healthcare) Does patient have an advance directive?: No Would patient like information on creating an advanced directive?: No - patient declined information    Additional Information 1:1 In Past 12 Months?: No CIRT Risk: No Elopement Risk: No Does patient have medical clearance?: Yes      Disposition: Roger Bolderori Beck, [facility], confirms adult unit is at capacity. Roger MachoLuke, Consulting civil engineerCharge RN [facility], says Pt is unable to be accepted to behavioral unit at this time. TTS will contact other facilities for placement. Notified Roger B Brumgard, RN  Disposition Initial Assessment Completed for this Encounter: Yes Disposition of Patient: Other dispositions Other disposition(s): Other (Comment)   [facility] Daugherty, [facility], [facility], [facility] Triage Specialist [phone]   Roger Daugherty, Roger Daugherty 03/26/2016 5:28 AM

## 2016-03-26 NOTE — ED Provider Notes (Signed)
[facility] Emergency Department Provider Note  ____________________________________________  Time seen: Approximately 4:12 AM  I have reviewed the triage vital signs and the nursing notes.   HISTORY  Chief Complaint Drug Overdose    HPI Roger Daugherty is a 25 y.o. male with a long-term history of benzodiazepine abuse and alcohol abuse.  He was seen within the last 2 days in the emergency department requesting help with benzodiazepine abuse, but he left because he felt it was taking too long the last time.  He "eats Xanax", as much as 50 mg daily.  He had Xanax today as well as 8 beers but his alcohol level is relatively low tonight in the 50s.  He denies SI/HI, fever/chills, chest pain, shortness of breath, nausea, vomiting, diarrhea, abdominal pain.Except for the benzodiazepine abuse, he is very healthy and runs ultramarathons.   Past Medical History  Diagnosis Date  . Adult ADHD     There are no active problems to display for this patient.   Past Surgical History  Procedure Laterality Date  . Knee surgery Right     Meniscal tear  . Tympanostomy tube placement      Current Outpatient Rx  Name  Route  Sig  Dispense  Refill  . albuterol (PROVENTIL HFA;VENTOLIN HFA) 108 (90 Base) MCG/ACT inhaler   Inhalation   Inhale 2 puffs into the lungs See admin instructions. Every 4-6 hours as needed         . buPROPion (WELLBUTRIN SR) 150 MG 12 hr tablet   Oral   Take 150 mg by mouth 2 (two) times daily.         Marland Kitchen linaclotide (LINZESS) 290 MCG CAPS capsule   Oral   Take 290 mcg by mouth daily before breakfast.         . mometasone (ELOCON) 0.1 % ointment   Topical   Apply topically daily as needed.         . vortioxetine HBr (TRINTELLIX) 20 MG TABS   Oral   Take 10 mg by mouth daily.           Allergies Review of patient's allergies indicates no known allergies.  No family history on file.  Social History Social History    Substance Use Topics  . Smoking status: Former Games developer  . Smokeless tobacco: Not on file  . Alcohol Use: 1.2 oz/week    2 Cans of beer per week     Comment: Daily    Review of Systems Constitutional: No fever/chills Eyes: No visual changes. ENT: No sore throat. Cardiovascular: Denies chest pain. Respiratory: Denies shortness of breath. Gastrointestinal: No abdominal pain.  No nausea, no vomiting.  No diarrhea.  No constipation. Genitourinary: Negative for dysuria. Musculoskeletal: Negative for back pain. Skin: Negative for rash. Neurological: Negative for headaches, focal weakness or numbness.  10-point ROS otherwise negative.  ____________________________________________   PHYSICAL EXAM:  VITAL SIGNS: ED Triage Vitals  Enc Vitals Group     BP 03/26/16 0324 101/66 mmHg     Pulse Rate 03/26/16 0324 38     Resp 03/26/16 0324 16     Temp 03/26/16 0324 97.6 F (36.4 C)     Temp Source 03/26/16 0324 Oral     SpO2 03/26/16 0324 100 %     Weight 03/26/16 0324 182 lb (82.555 kg)     Height 03/26/16 0324  (1.905 m)     Head Cir --      Peak Flow --  Pain Score 03/26/16 0326 0     Pain Loc --      Pain Edu? --      Excl. in GC? --     Constitutional: Alert and oriented. Well appearing and in no acute distress. Eyes: Conjunctivae are normal. PERRL. EOMI. Head: Atraumatic. Nose: No congestion/rhinnorhea. Mouth/Throat: Mucous membranes are moist.  Oropharynx non-erythematous. Neck: No stridor.  No meningeal signs.   Cardiovascular: Normal rate, regular rhythm. Good peripheral circulation. Grossly normal heart sounds.   Respiratory: Normal respiratory effort.  No retractions. Lungs CTAB. Gastrointestinal: Soft and nontender. No distention.  Musculoskeletal: No lower extremity tenderness nor edema. No gross deformities of extremities. Neurologic:  Normal speech and language. No gross focal neurologic deficits are appreciated.  Skin:  Skin is warm, dry and intact.  No rash noted. Psychiatric: Mood and affect are normal. Speech and behavior are normal.  ____________________________________________   LABS (all labs ordered are listed, but only abnormal results are displayed)  Labs Reviewed  CBC WITH DIFFERENTIAL/PLATELET - Abnormal; Notable for the following:    RBC 4.27 (*)    All other components within normal limits  COMPREHENSIVE METABOLIC PANEL - Abnormal; Notable for the following:    Glucose, Bld 52 (*)    Alkaline Phosphatase 35 (*)    All other components within normal limits  ETHANOL - Abnormal; Notable for the following:    Alcohol, Ethyl (B) 47 (*)    All other components within normal limits  ACETAMINOPHEN LEVEL - Abnormal; Notable for the following:    Acetaminophen (Tylenol), Serum <10 (*)    All other components within normal limits  GLUCOSE, CAPILLARY - Abnormal; Notable for the following:    Glucose-Capillary 102 (*)    All other components within normal limits  SALICYLATE LEVEL  URINALYSIS COMPLETEWITH MICROSCOPIC ([facility] ONLY)  URINE DRUG SCREEN, QUALITATIVE ([facility] ONLY)  CBG MONITORING, ED   ____________________________________________  EKG  ED ECG REPORT I, Barbarann Kelly, the attending physician, personally viewed and interpreted this ECG.  Date: 03/26/2016 EKG Time: 03:24 Rate: 41 Rhythm: Sinus bradycardia QRS Axis: normal Intervals: normal ST/T Wave abnormalities: normal Conduction Disturbances: none Narrative Interpretation: unremarkable  ____________________________________________  RADIOLOGY   No results found.  ____________________________________________   PROCEDURES  Procedure(s) performed:   Procedures   ____________________________________________   INITIAL IMPRESSION / ASSESSMENT AND PLAN / ED COURSE  Pertinent labs & imaging results that were available during my care of the patient were reviewed by me and considered in my medical decision making (see chart for details).  The  patient has no acute medical complaints or concerns at this time.  He does not meet criteria for involuntary commitment.  He remains voluntary at this time.  Should he decide he no longer wants to wait for assistance he may be discharged.  I have consulted TTS and we will move him to the BHU to await evaluation and disposition recommendations.   ____________________________________________  FINAL CLINICAL IMPRESSION(S) / ED DIAGNOSES  Final diagnoses:  Benzodiazepine abuse, continuous     MEDICATIONS GIVEN DURING THIS VISIT:  Medications  LORazepam (ATIVAN) tablet 0-4 mg (not administered)    Followed by  LORazepam (ATIVAN) tablet 0-4 mg (not administered)  thiamine (VITAMIN B-1) tablet 100 mg (not administered)    Or  thiamine (B-1) injection 100 mg (not administered)     NEW OUTPATIENT MEDICATIONS STARTED DURING THIS VISIT:  New Prescriptions   No medications on file      Note:  This document was prepared  using Conservation officer, historic buildingsDragon voice recognition software and may include unintentional dictation errors.   Loleta Roseory Gervase Colberg, MD 03/26/16 [phone]

## 2016-03-26 NOTE — ED Notes (Signed)
Pt states has had 8 beers this pm and last benzodiazepine ingestion was approx 1.5 hours pta. Pt states "i eat xanax." pt also relates takes klonopin.

## 2016-03-26 NOTE — ED Notes (Signed)
Pt with multiple requests. Pt states "how long is this going to take?" pt informed of treatment process and md evaluation. Pt states "it just takes too damn long". Pt texting on phone in room in no acute distress. Cardiac monitor in place, sinus bradycardia noted. resps unlabored. Pt states "i feel like i can't breathe." pt with slightly slurred speech, but no resp distress noted. Cont pox in place. Call bell at left side. Warm blankets provided per pt request. Pt denies HI or SI.

## 2016-03-26 NOTE — ED Notes (Signed)
Onalee Huaavid, rn in to in and out cath pt for urine sample.

## 2016-03-26 NOTE — Discharge Instructions (Signed)
Polysubstance Abuse When people abuse more than one drug or type of drug it is called polysubstance or polydrug abuse. For example, many smokers also drink alcohol. This is one form of polydrug abuse. Polydrug abuse also refers to the use of a drug to counteract an unpleasant effect produced by another drug. It may also be used to help with withdrawal from another drug. People who take stimulants may become agitated. Sometimes this agitation is countered with a tranquilizer. This helps protect against the unpleasant side effects. Polydrug abuse also refers to the use of different drugs at the same time.  Anytime drug use is interfering with normal living activities, it has become abuse. This includes problems with family and friends. Psychological dependence has developed when your mind tells you that the drug is needed. This is usually followed by physical dependence which has developed when continuing increases of drug are required to get the same feeling or "high". This is known as addiction or chemical dependency. A person's risk is much higher if there is a history of chemical dependency in the family. SIGNS OF CHEMICAL DEPENDENCY  You have been told by friends or family that drugs have become a problem.  You fight when using drugs.  You are having blackouts (not remembering what you do while using).  You feel sick from using drugs but continue using.  You lie about use or amounts of drugs (chemicals) used.  You need chemicals to get you going.  You are suffering in work performance or in school because of drug use.  You get sick from use of drugs but continue to use anyway.  You need drugs to relate to people or feel comfortable in social situations.  You use drugs to forget problems. "Yes" answered to any of the above signs of chemical dependency indicates there are problems. The longer the use of drugs continues, the greater the problems will become. If there is a family history of  drug or alcohol use, it is best not to experiment with these drugs. Continual use leads to tolerance. After tolerance develops more of the drug is needed to get the same feeling. This is followed by addiction. With addiction, drugs become the most important part of life. It becomes more important to take drugs than participate in the other usual activities of life. This includes relating to friends and family. Addiction is followed by dependency. Dependency is a condition where drugs are now needed not just to get high, but to feel normal. Addiction cannot be cured but it can be stopped. This often requires outside help and the care of professionals. Treatment centers are listed in the yellow pages under: Cocaine, Narcotics, and Alcoholics Anonymous. Most hospitals and clinics can refer you to a specialized care center. Talk to your caregiver if you need help.   This information is not intended to replace advice given to you by your health care provider. Make sure you discuss any questions you have with your health care provider.   Document Released: 04/20/2005 Document Revised: 11/21/2011 Document Reviewed: 09/03/2014 Elsevier Interactive Patient Education 2016 [employer]   You have been seen in the Emergency Department (ED)  today for a psychiatric complaint.  You have been evaluated by psychiatry and we believe you are safe to be discharged from the hospital.    Please return to the Emergency Department (ED)  immediately if you have ANY thoughts of hurting yourself or anyone else, so that we may help you.  Please avoid alcohol  and drug use.  Follow up with your doctor and/or therapist as soon as possible regarding today's ED  visit.   You may call crisis hotline for [facility] at [phone].

## 2016-03-26 NOTE — ED Notes (Signed)
Pt provided with meal tray and orange juice for blood sugar of 52 per md request. Pt swallowing and eating without difficulty. Pt questioning wait time. Pt updated on wait time.

## 2016-05-24 ENCOUNTER — Telehealth (HOSPITAL_COMMUNITY): Payer: Self-pay

## 2016-05-24 NOTE — Telephone Encounter (Signed)
Called pt about the v/mail he left

## 2016-05-31 ENCOUNTER — Other Ambulatory Visit
Admit: 2016-05-31 | Discharge: 2016-05-31 | Disposition: A | Discharge status: Home / Self Care | Attending: Family Medicine | Admitting: Family Medicine

## 2016-05-31 NOTE — ED Notes (Signed)
Patient ambulatory to triage with steady gait, without difficulty or distress noted, in custody of [place] [employer] officer Concha Norway for forensic blood draw; pt A&Ox3, with no c/o voiced and denies need to see ED provider; pt voices good understanding of blood draw to be performed for forensic testing per court order as read to pt by officer Estill Cotta; using sealed kit provided by officer, tourniquet applied to right upper arm; right antecubital region prepped with betadine swab and allowed to dry completely; needle inserted and 2 grey top blood tubes collected; tourniquet removed, needle removed & intact, dressing applied; tubes labeled, given to officer and placed in sealed container using chain of custody; pt tolerated well and continues to deny c/o or need to see ED provider; pt d/c in police custody

## 2016-06-16 ENCOUNTER — Ambulatory Visit: Payer: Self-pay | Admitting: Family Medicine

## 2016-06-21 NOTE — Progress Notes (Deleted)
Psychiatric Initial Adult Assessment   Patient Identification: Roger Daugherty MRN:  [id number] Date of Evaluation:  06/21/2016 Referral Source: *** Chief Complaint:   Visit Diagnosis: No diagnosis found.  History of Present Illness:  ***  Xanax, Klonopin use Associated Signs/Symptoms: Depression Symptoms:  {DEPRESSION SYMPTOMS:20000} (Hypo) Manic Symptoms:  {BHH MANIC SYMPTOMS:22872} Anxiety Symptoms:  {BHH ANXIETY SYMPTOMS:22873} Psychotic Symptoms:  {BHH PSYCHOTIC SYMPTOMS:22874} PTSD Symptoms: {BHH PTSD SYMPTOMS:22875}  Past Psychiatric History: ***  Previous Psychotropic Medications: {YES/NO:21197}  Substance Abuse History in the last 12 months:  {yes no:314532}  Consequences of Substance Abuse: {BHH CONSEQUENCES OF SUBSTANCE ABUSE:22880}  Past Medical History:  Past Medical History:  Diagnosis Date  . Adult ADHD     Past Surgical History:  Procedure Laterality Date  . KNEE SURGERY Right    Meniscal tear  . TYMPANOSTOMY TUBE PLACEMENT      Family Psychiatric History: ***  Family History: No family history on file.  Social History:   Social History   Social History  . Marital status: Single    Spouse name: N/A  . Number of children: N/A  . Years of education: N/A   Social History Main Topics  . Smoking status: Former Games developer  . Smokeless tobacco: Not on file  . Alcohol use 1.2 oz/week    2 Cans of beer per week     Comment: Daily  . Drug use:     Types: Marijuana     Comment: Benzo  . Sexual activity: Yes    Birth control/ protection: None   Other Topics Concern  . Not on file   Social History Narrative  . No narrative on file    Additional Social History: ***  Allergies:  No Known Allergies  Metabolic Disorder Labs: No results found for: HGBA1C, MPG No results found for: PROLACTIN No results found for: CHOL, TRIG, HDL, CHOLHDL, VLDL, LDLCALC   Current Medications: Current Outpatient Prescriptions  Medication Sig Dispense Refill   . albuterol (PROVENTIL HFA;VENTOLIN HFA) 108 (90 Base) MCG/ACT inhaler Inhale 2 puffs into the lungs See admin instructions. Every 4-6 hours as needed    . buPROPion (WELLBUTRIN SR) 150 MG 12 hr tablet Take 150 mg by mouth 2 (two) times daily.    Marland Kitchen linaclotide (LINZESS) 290 MCG CAPS capsule Take 290 mcg by mouth daily before breakfast.    . mometasone (ELOCON) 0.1 % ointment Apply topically daily as needed.    . vortioxetine HBr (TRINTELLIX) 20 MG TABS Take 10 mg by mouth daily.     No current facility-administered medications for this visit.     Neurologic: Headache: {BHH YES OR NO:22294} Seizure: {BHH YES OR NO:22294} Paresthesias:{BHH YES OR WU:98119}  Musculoskeletal: Strength & Muscle Tone: {desc; muscle tone:32375} Gait & Station: {PE GAIT ED JYNW:29562} Patient leans: {Patient Leans:21022755}  Psychiatric Specialty Exam: ROS  There were no vitals taken for this visit.There is no height or weight on file to calculate BMI.  General Appearance: {Appearance:22683}  Eye Contact:  {BHH EYE CONTACT:22684}  Speech:  {Speech:22685}  Volume:  {Volume (PAA):22686}  Mood:  {BHH MOOD:22306}  Affect:  {Affect (PAA):22687}  Thought Process:  {Thought Process (PAA):22688}  Orientation:  {BHH ORIENTATION (PAA):22689}  Thought Content:  {Thought Content:22690}  Suicidal Thoughts:  {ST/HT (PAA):22692}  Homicidal Thoughts:  {ST/HT (PAA):22692}  Memory:  {BHH ZHYQMV:78469}  Judgement:  {Judgement (PAA):22694}  Insight:  {Insight (PAA):22695}  Psychomotor Activity:  {Psychomotor (PAA):22696}  Concentration:  {Concentration:21399}  Recall:  {BHH GOOD/FAIR/POOR:22877}  Fund of Knowledge:{BHH  GOOD/FAIR/POOR:22877}  Language: {BHH GOOD/FAIR/POOR:22877}  Akathisia:  {BHH YES OR NO:22294}  Handed:  {Handed:22697}  AIMS (if indicated):  ***  Assets:  {Assets (PAA):22698}  ADL's:  {BHH WUJ'W:11914}ADL'S:22290}  Cognition: {chl bhh cognition:304700322}  Sleep:  ***    Treatment Plan Summary: {CHL  AMB BH MD TX NWGN:[id number]}Plan:[phone]}   Neysa Hottereina Veronica Fretz, MD 10/10/20171:31 PM

## 2016-06-22 ENCOUNTER — Ambulatory Visit (HOSPITAL_COMMUNITY): Payer: Self-pay | Admitting: Psychiatry

## 2017-07-27 ENCOUNTER — Other Ambulatory Visit: Payer: Self-pay

## 2017-07-27 ENCOUNTER — Emergency Department
Admission: EM | Admit: 2017-07-27 | Discharge: 2017-07-28 | Disposition: A | Discharge status: Home / Self Care | Payer: BLUE CROSS/BLUE SHIELD | Attending: Student in an Organized Health Care Education/Training Program | Admitting: Student in an Organized Health Care Education/Training Program

## 2017-07-27 ENCOUNTER — Emergency Department: Payer: BLUE CROSS/BLUE SHIELD

## 2017-07-27 ENCOUNTER — Encounter: Payer: Self-pay | Admitting: *Deleted

## 2017-07-27 DIAGNOSIS — Y998 Other external cause status: Secondary | ICD-10-CM | POA: Insufficient documentation

## 2017-07-27 DIAGNOSIS — S61011A Laceration without foreign body of right thumb without damage to nail, initial encounter: Secondary | ICD-10-CM

## 2017-07-27 DIAGNOSIS — Z79899 Other long term (current) drug therapy: Secondary | ICD-10-CM | POA: Diagnosis not present

## 2017-07-27 DIAGNOSIS — F17200 Nicotine dependence, unspecified, uncomplicated: Secondary | ICD-10-CM | POA: Diagnosis not present

## 2017-07-27 DIAGNOSIS — W278XXA Contact with other nonpowered hand tool, initial encounter: Secondary | ICD-10-CM | POA: Diagnosis not present

## 2017-07-27 DIAGNOSIS — Y93H2 Activity, gardening and landscaping: Secondary | ICD-10-CM | POA: Diagnosis not present

## 2017-07-27 DIAGNOSIS — Y929 Unspecified place or not applicable: Secondary | ICD-10-CM | POA: Diagnosis not present

## 2017-07-27 MED ORDER — LIDOCAINE HCL 1 % IJ SOLN
10.0000 mL | Freq: Once | INTRAMUSCULAR | Status: AC
  Administered 2017-07-27: 10 mL
  Filled 2017-07-27: qty 10

## 2017-07-27 MED ORDER — SULFAMETHOXAZOLE-TRIMETHOPRIM 800-160 MG PO TABS: 1.0000 | ORAL_TABLET | Freq: Two times a day (BID) | ORAL | 0 refills | Status: AC

## 2017-07-27 MED ORDER — LIDOCAINE HCL (PF) 1 % IJ SOLN
INTRAMUSCULAR | Status: AC
  Filled 2017-07-27: qty 10

## 2017-07-27 NOTE — ED Notes (Signed)
Patient states his girlfriend drove him to the hospital. Patient has ETOH odor.

## 2017-07-27 NOTE — ED Provider Notes (Signed)
[facility] Emergency Department Provider Note  ____________________________________________  Time seen: Approximately 11:55 PM  I have reviewed the triage vital signs and the nursing notes.   HISTORY  Chief Complaint Extremity Laceration    HPI Roger Daugherty is a 26 y.o. male presents to the emergency department with a 2 cm right thumb laceration after chopping wood.  Patient denies weakness, radiculopathy or changes in sensation of the upper extremities.  Tetanus status is up-to-date.  No alleviating measures have been attempted aside from a clean dressing.   Past Medical History:  Diagnosis Date  . Adult ADHD     Patient Active Problem List   Diagnosis Date Noted  . Benzodiazepine dependence (HCC) 03/26/2016  . Substance induced mood disorder (HCC) 03/26/2016    Past Surgical History:  Procedure Laterality Date  . KNEE SURGERY Right    Meniscal tear  . TYMPANOSTOMY TUBE PLACEMENT      Prior to Admission medications   Medication Sig Start Date End Date Taking? Authorizing Provider  albuterol (PROVENTIL HFA;VENTOLIN HFA) 108 (90 Base) MCG/ACT inhaler Inhale 2 puffs into the lungs See admin instructions. Every 4-6 hours as needed    [provider]  buPROPion (WELLBUTRIN SR) 150 MG 12 hr tablet Take 150 mg by mouth 2 (two) times daily.    [provider]  linaclotide (LINZESS) 290 MCG CAPS capsule Take 290 mcg by mouth daily before breakfast.    [provider]  mometasone (ELOCON) 0.1 % ointment Apply topically daily as needed.    [provider]  sulfamethoxazole-trimethoprim (BACTRIM DS,SEPTRA DS) 800-160 MG tablet Take 1 tablet 2 (two) times daily for 7 days by mouth. 07/27/17 08/03/17  Orvil Feil, PA-C  vortioxetine HBr (TRINTELLIX) 20 MG TABS Take 10 mg by mouth daily.    [provider]    Allergies Patient has no known allergies.  No family history on file.  Social History Social  History   Tobacco Use  . Smoking status: Current Some Day Smoker  . Smokeless tobacco: Never Used  Substance Use Topics  . Alcohol use: Yes    Alcohol/week: 1.2 oz    Types: 2 Cans of beer per week    Comment: Daily  . Drug use: Yes    Types: Marijuana    Comment: Benzo     Review of Systems  Constitutional: No fever/chills Eyes: No visual changes. No discharge ENT: No upper respiratory complaints. Cardiovascular: no chest pain. Respiratory: no cough. No SOB. Gastrointestinal: No abdominal pain.  No nausea, no vomiting.  No diarrhea.  No constipation. Musculoskeletal: Patient has right thumb laceration. Skin: Negative for rash, abrasions, lacerations, ecchymosis. Neurological: Negative for headaches, focal weakness or numbness.   ____________________________________________   PHYSICAL EXAM:  VITAL SIGNS: ED Triage Vitals  Enc Vitals Group     BP 07/27/17 2217 118/76     Pulse Rate 07/27/17 2217 82     Resp 07/27/17 2217 18     Temp 07/27/17 2217 98.6 F (37 C)     Temp Source 07/27/17 2217 Oral     SpO2 07/27/17 2217 97 %     Weight 07/27/17 2219 205 lb (93 kg)     Height 07/27/17 2219 6\' 3"  (1.905 m)     Head Circumference --      Peak Flow --      Pain Score 07/27/17 2217 10     Pain Loc --      Pain Edu? --  Excl. in GC? --      Constitutional: Alert and oriented. Well appearing and in no acute distress. Eyes: Conjunctivae are normal. PERRL. EOMI. Head: Atraumatic. Cardiovascular: Normal rate, regular rhythm. Normal S1 and S2.  Good peripheral circulation. Respiratory: Normal respiratory effort without tachypnea or retractions. Lungs CTAB. Good air entry to the bases with no decreased or absent breath sounds. Musculoskeletal: Full range of motion to all extremities. No gross deformities appreciated. Neurologic:  Normal speech and language. No gross focal neurologic deficits are appreciated.  Skin: Patient has 2 cm right thumb laceration.  Palpable  radial pulse, right. Psychiatric: Mood and affect are normal. Speech and behavior are normal. Patient exhibits appropriate insight and judgement.   ____________________________________________   LABS (all labs ordered are listed, but only abnormal results are displayed)  Labs Reviewed - No data to display ____________________________________________  EKG   ____________________________________________  RADIOLOGY Geraldo PitterI, Jonny Longino M Iridian Reader, personally viewed and evaluated these images (plain radiographs) as part of my medical decision making, as well as reviewing the written report by the radiologist. Is a has been independent  Distal phalanx of right thumb has a nondisplaced, non-intra-articular fracture.  No results found.  ____________________________________________    PROCEDURES  Procedure(s) performed:    Procedures  LACERATION REPAIR Performed by: Orvil FeilJaclyn M Burlene Montecalvo Authorized by: Orvil FeilJaclyn M Rasheida Broden Consent: Verbal consent obtained. Risks and benefits: risks, benefits and alternatives were discussed Consent given by: patient Patient identity confirmed: provided demographic data Prepped and Draped in normal sterile fashion Wound explored  Laceration Location: Right Thumb  Laceration Length: 2 cm  No Foreign Bodies seen or palpated  Anesthesia: local infiltration  Local anesthetic: lidocaine 1% without epinephrine  Anesthetic total: 7 ml  Irrigation method: syringe Amount of cleaning: standard  Skin closure: 4-0 Ethilon   Number of sutures: 6  Technique: Simple Interrupted   Patient tolerance: Patient tolerated the procedure well with no immediate complications.   Medications  lidocaine (PF) (XYLOCAINE) 1 % injection (not administered)  lidocaine (XYLOCAINE) 1 % (with pres) injection 10 mL (10 mLs Infiltration Given 07/27/17 2355)     ____________________________________________   INITIAL IMPRESSION / ASSESSMENT AND PLAN / ED COURSE  Pertinent labs &  imaging results that were available during my care of the patient were reviewed by me and considered in my medical decision making (see chart for details).  Review of the [place] CSRS was performed in accordance of the [facility] prior to dispensing any controlled drugs.     Assessment and plan Laceration Right thumb fracture Patient presents to the emergency department with a 2 cm laceration of the right thumb with a nondisplaced fracture of the distal phalanx identified on x-ray.  Patient underwent laceration repair without complication.  He was advised to have sutures removed by primary care in 9 days.  Vital signs are reassuring prior to discharge.  All patient questions were answered.    ____________________________________________  FINAL CLINICAL IMPRESSION(S) / ED DIAGNOSES  Final diagnoses:  Laceration of right thumb without foreign body without damage to nail, initial encounter      NEW MEDICATIONS STARTED DURING THIS VISIT:  ED Discharge Orders        Ordered    sulfamethoxazole-trimethoprim (BACTRIM DS,SEPTRA DS) 800-160 MG tablet  2 times daily     07/27/17 2348          This chart was dictated using voice recognition software/Dragon. Despite best efforts to proofread, errors can occur which can change the meaning. Any change was  purely unintentional.    Orvil FeilWoods, Derricka Mertz M, PA-C 07/28/17 0000    Willy Eddyobinson, Patrick, MD 07/28/17 [phone]

## 2017-07-27 NOTE — ED Triage Notes (Signed)
Pt has a lacertion to right thumb.  Pt  Was cutting wood with a hachet.  Bleeding controlled.  Pt has had 4 beers tonight.

## 2017-08-07 ENCOUNTER — Other Ambulatory Visit: Payer: Self-pay

## 2017-08-07 ENCOUNTER — Emergency Department
Admission: EM | Admit: 2017-08-07 | Discharge: 2017-08-07 | Disposition: A | Discharge status: Home / Self Care | Payer: BLUE CROSS/BLUE SHIELD | Attending: Emergency Medicine | Admitting: Emergency Medicine

## 2017-08-07 ENCOUNTER — Encounter: Payer: Self-pay | Admitting: Emergency Medicine

## 2017-08-07 DIAGNOSIS — F13239 Sedative, hypnotic or anxiolytic dependence with withdrawal, unspecified: Secondary | ICD-10-CM | POA: Diagnosis present

## 2017-08-07 DIAGNOSIS — Z79899 Other long term (current) drug therapy: Secondary | ICD-10-CM | POA: Insufficient documentation

## 2017-08-07 DIAGNOSIS — F13230 Sedative, hypnotic or anxiolytic dependence with withdrawal, uncomplicated: Secondary | ICD-10-CM

## 2017-08-07 DIAGNOSIS — F17200 Nicotine dependence, unspecified, uncomplicated: Secondary | ICD-10-CM | POA: Diagnosis not present

## 2017-08-07 DIAGNOSIS — F13930 Sedative, hypnotic or anxiolytic use, unspecified with withdrawal, uncomplicated: Secondary | ICD-10-CM

## 2017-08-07 HISTORY — DX: Other psychoactive substance abuse, uncomplicated: F19.10

## 2017-08-07 LAB — URINE DRUG SCREEN, QUALITATIVE (ARMC ONLY)
AMPHETAMINES, UR SCREEN: POSITIVE — AB
BARBITURATES, UR SCREEN: NOT DETECTED
BENZODIAZEPINE, UR SCRN: POSITIVE — AB
CANNABINOID 50 NG, UR ~~LOC~~: POSITIVE — AB
Cocaine Metabolite,Ur ~~LOC~~: NOT DETECTED
MDMA (Ecstasy)Ur Screen: NOT DETECTED
Methadone Scn, Ur: NOT DETECTED
OPIATE, UR SCREEN: NOT DETECTED
PHENCYCLIDINE (PCP) UR S: NOT DETECTED
Tricyclic, Ur Screen: NOT DETECTED

## 2017-08-07 LAB — COMPREHENSIVE METABOLIC PANEL
ALBUMIN: 4.4 g/dL (ref 3.5–5.0)
ALK PHOS: 46 U/L (ref 38–126)
ALT: 19 U/L (ref 17–63)
AST: 25 U/L (ref 15–41)
Anion gap: 11 (ref 5–15)
BILIRUBIN TOTAL: 0.7 mg/dL (ref 0.3–1.2)
BUN: 11 mg/dL (ref 6–20)
CALCIUM: 9.3 mg/dL (ref 8.9–10.3)
CO2: 23 mmol/L (ref 22–32)
CREATININE: 0.87 mg/dL (ref 0.61–1.24)
Chloride: 104 mmol/L (ref 101–111)
GFR calc Af Amer: >60 mL/min (ref >60)
GLUCOSE: 132 mg/dL — AB (ref 65–99)
Potassium: 3.5 mmol/L (ref 3.5–5.1)
Sodium: 138 mmol/L (ref 135–145)
TOTAL PROTEIN: 7.4 g/dL (ref 6.5–8.1)

## 2017-08-07 LAB — ETHANOL: ALCOHOL ETHYL (B): 52 mg/dL — AB (ref <10)

## 2017-08-07 LAB — CBC
HEMATOCRIT: 41.7 % (ref 40.0–52.0)
Hemoglobin: 14.4 g/dL (ref 13.0–18.0)
MCH: 31.2 pg (ref 26.0–34.0)
MCHC: 34.6 g/dL (ref 32.0–36.0)
MCV: 90.4 fL (ref 80.0–100.0)
Platelets: 244 10*3/uL (ref 150–440)
RBC: 4.61 MIL/uL (ref 4.40–5.90)
RDW: 12 % (ref 11.5–14.5)
WBC: 7.3 10*3/uL (ref 3.8–10.6)

## 2017-08-07 LAB — SALICYLATE LEVEL: Salicylate Lvl: <7 mg/dL (ref 2.8–30.0)

## 2017-08-07 LAB — ACETAMINOPHEN LEVEL: Acetaminophen (Tylenol), Serum: <10 ug/mL — ABNORMAL LOW (ref 10–30)

## 2017-08-07 LAB — TROPONIN I: Troponin I: <0.03 ng/mL (ref <0.03)

## 2017-08-07 MED ORDER — LORAZEPAM 2 MG PO TABS
2.0000 mg | ORAL_TABLET | Freq: Once | ORAL | Status: AC
  Administered 2017-08-07: 2 mg via ORAL
  Filled 2017-08-07: qty 1

## 2017-08-07 NOTE — ED Provider Notes (Signed)
[facility] Emergency Department Provider Note   ____________________________________________   First MD Initiated Contact with Patient 08/07/17 0220     (approximate)  I have reviewed the triage vital signs and the nursing notes.   HISTORY  Chief Complaint Withdrawal    HPI Roger Daugherty is a 26 y.o. male who presents to the ED from home with a chief complaint of benzodiazepine withdrawal.  Patient admits to taking 8-10 mg of clonazepam daily.  He is not prescribed this medicine but obtains it from the street.  Has not had any benzos for 48 hours.  Drink 2 beers this evening to mitigate his withdrawal symptoms.  Complains of feeling restless, having racing thoughts, insomnia and crying spells.  Denies active SI/HI/AH/VH.  Has an appointment with a psychiatrist today at 5 PM.  Denies fever, chills, chest pain, shortness of breath, abdominal pain, nausea, vomiting.  Denies recent travel or trauma.   Past Medical History:  Diagnosis Date  . Adult ADHD   . Substance abuse [facility](HCC)     Patient Active Problem List   Diagnosis Date Noted  . Benzodiazepine dependence (HCC) 03/26/2016  . Substance induced mood disorder (HCC) 03/26/2016    Past Surgical History:  Procedure Laterality Date  . FEMUR FRACTURE SURGERY    . KNEE SURGERY Right    Meniscal tear  . TYMPANOSTOMY TUBE PLACEMENT      Prior to Admission medications   Medication Sig Start Date End Date Taking? Authorizing Provider  albuterol (PROVENTIL HFA;VENTOLIN HFA) 108 (90 Base) MCG/ACT inhaler Inhale 2 puffs into the lungs See admin instructions. Every 4-6 hours as needed   Yes [provider]  amphetamine-dextroamphetamine (ADDERALL) 20 MG tablet Take 60 mg by mouth 3 (three) times daily.   Yes [provider]  QUEtiapine (SEROQUEL) 50 MG tablet Take 50 mg by mouth at bedtime.   Yes [provider]    Allergies Patient has no known allergies.  History reviewed.  No pertinent family history.  Social History Social History   Tobacco Use  . Smoking status: Current Some Day Smoker  . Smokeless tobacco: Never Used  Substance Use Topics  . Alcohol use: Yes    Alcohol/week: 1.2 oz    Types: 2 Cans of beer per week    Comment: Daily  . Drug use: Yes    Types: Marijuana    Comment: Benzo    Review of Systems  Constitutional: No fever/chills. Eyes: No visual changes. ENT: No sore throat. Cardiovascular: Denies chest pain. Respiratory: Denies shortness of breath. Gastrointestinal: No abdominal pain.  No nausea, no vomiting.  No diarrhea.  No constipation. Genitourinary: Negative for dysuria. Musculoskeletal: Negative for back pain. Skin: Negative for rash. Neurological: Negative for headaches, focal weakness or numbness. Psychiatric:Positive for benzodiazepine withdrawal symptoms including restlessness, and some thoughts and crying.  Negative for SI/HI/VH/AH.  ____________________________________________   PHYSICAL EXAM:  VITAL SIGNS: ED Triage Vitals  Enc Vitals Group     BP 08/07/17 0134 129/73     Pulse Rate 08/07/17 0134 (!) 134     Resp 08/07/17 0134 18     Temp 08/07/17 0134 98.8 F (37.1 C)     Temp Source 08/07/17 0134 Oral     SpO2 08/07/17 0134 99 %     Weight 08/07/17 0135 205 lb (93 kg)     Height 08/07/17 0135 6\' 3"  (1.905 m)     Head Circumference --      Peak Flow --  Pain Score 08/07/17 0133 0     Pain Loc --      Pain Edu? --      Excl. in GC? --     Constitutional: Alert and oriented.  Disheveled appearing and in no acute distress. Eyes: Conjunctivae are bloodshot bilaterally. PERRL. EOMI. Head: Atraumatic. Nose: No congestion/rhinnorhea. Mouth/Throat: Mucous membranes are moist.  Oropharynx non-erythematous. Neck: No stridor.   Cardiovascular: Normal rate, regular rhythm. Grossly normal heart sounds.  Good peripheral circulation. Respiratory: Normal respiratory effort.  No retractions. Lungs  CTAB. Gastrointestinal: Soft and nontender. No distention. No abdominal bruits. No CVA tenderness. Musculoskeletal: No lower extremity tenderness nor edema.  No joint effusions. Neurologic:  Normal speech and language. No gross focal neurologic deficits are appreciated. No gait instability. Skin:  Skin is warm, dry and intact. No rash noted. Psychiatric: Mood and affect are normal. Speech and behavior are normal.  ____________________________________________   LABS (all labs ordered are listed, but only abnormal results are displayed)  Labs Reviewed  COMPREHENSIVE METABOLIC PANEL - Abnormal; Notable for the following components:      Result Value   Glucose, Bld 132 (*)    All other components within normal limits  ETHANOL - Abnormal; Notable for the following components:   Alcohol, Ethyl (B) 52 (*)    All other components within normal limits  ACETAMINOPHEN LEVEL - Abnormal; Notable for the following components:   Acetaminophen (Tylenol), Serum <10 (*)    All other components within normal limits  URINE DRUG SCREEN, QUALITATIVE ([facility] ONLY) - Abnormal; Notable for the following components:   Amphetamines, Ur Screen POSITIVE (*)    Cannabinoid 50 Ng, Ur Sabina POSITIVE (*)    Benzodiazepine, Ur Scrn POSITIVE (*)    All other components within normal limits  SALICYLATE LEVEL  CBC  TROPONIN I   ____________________________________________  EKG  ED ECG REPORT I, Javares Kaufhold J, the attending physician, personally viewed and interpreted this ECG.   Date: 08/07/2017  EKG Time: 0155  Rate: 114  Rhythm: sinus tachycardia  Axis: Normal  Intervals:right bundle branch block  ST&T Change: Nonspecific  ____________________________________________  RADIOLOGY  No results found.  ____________________________________________   PROCEDURES  Procedure(s) performed: None  Procedures  Critical Care performed: No  ____________________________________________   INITIAL IMPRESSION /  ASSESSMENT AND PLAN / ED COURSE  As part of my medical decision making, I reviewed the following data within the electronic MEDICAL RECORD NUMBER Nursing notes reviewed and incorporated, EKG interpreted and Notes from prior ED visits.   26 year old male who presents with benzodiazepine withdrawal; quit "cold [place]" 48 hours ago.  He is not currently tachycardic nor hypertensive.  Laboratory and urinalysis results remarkable for mild EtOH, positive for amphetamines, cannabinoids and benzodiazepines.  He is not suicidal and not interested in inpatient treatment programs.  He has an appointment at 5 PM today [place].  Will administer oral Ativan to mitigate his withdrawal symptoms until his appointment later this afternoon.  Strict return precautions given.  Patient verbalizes understanding and agrees with plan of care.      ____________________________________________   FINAL CLINICAL IMPRESSION(S) / ED DIAGNOSES  Final diagnoses:  Benzodiazepine withdrawal without complication [facility](HCC)     ED Discharge Orders    None       Note:  This document was prepared using Dragon voice recognition software and may include unintentional dictation errors.    Irean HongSung, Janay Canan J, MD 08/07/17 [phone]

## 2017-08-07 NOTE — ED Triage Notes (Signed)
Pt says he's here for withdrawal from Clonazepam; says he's been taking 8-10mg  daily but not prescribed this medication; admits to 2 beers this evening; occasional marijuana use but none in 4 days; pt says he hasn't had any Clonazepam in the last 2 days and is having racing thoughts; denies suicidal thoughts; pt says he can't sleep, he's been having crying spells; pt calm and cooperative in triage;

## 2017-08-07 NOTE — ED Notes (Signed)
Pt used call bell; provided with warm blanket and pillow for comfort; HOB lowered; lights dimmed at pt request; awaiting MD assessment

## 2017-08-07 NOTE — Discharge Instructions (Signed)
Please keep your appointment with the psychiatrist this afternoon.  Return to the ER for worsening symptoms, persistent vomiting, difficult breathing or other concerns.

## 2018-06-09 IMAGING — DX DG FINGER THUMB 2+V*R*
3 series · 3 of 3 positions shown · non-contrast
Comparison: None.

CLINICAL DATA: 26-year-old male with laceration of the right thumb.

EXAM:
RIGHT THUMB 2+V

[finger ap]
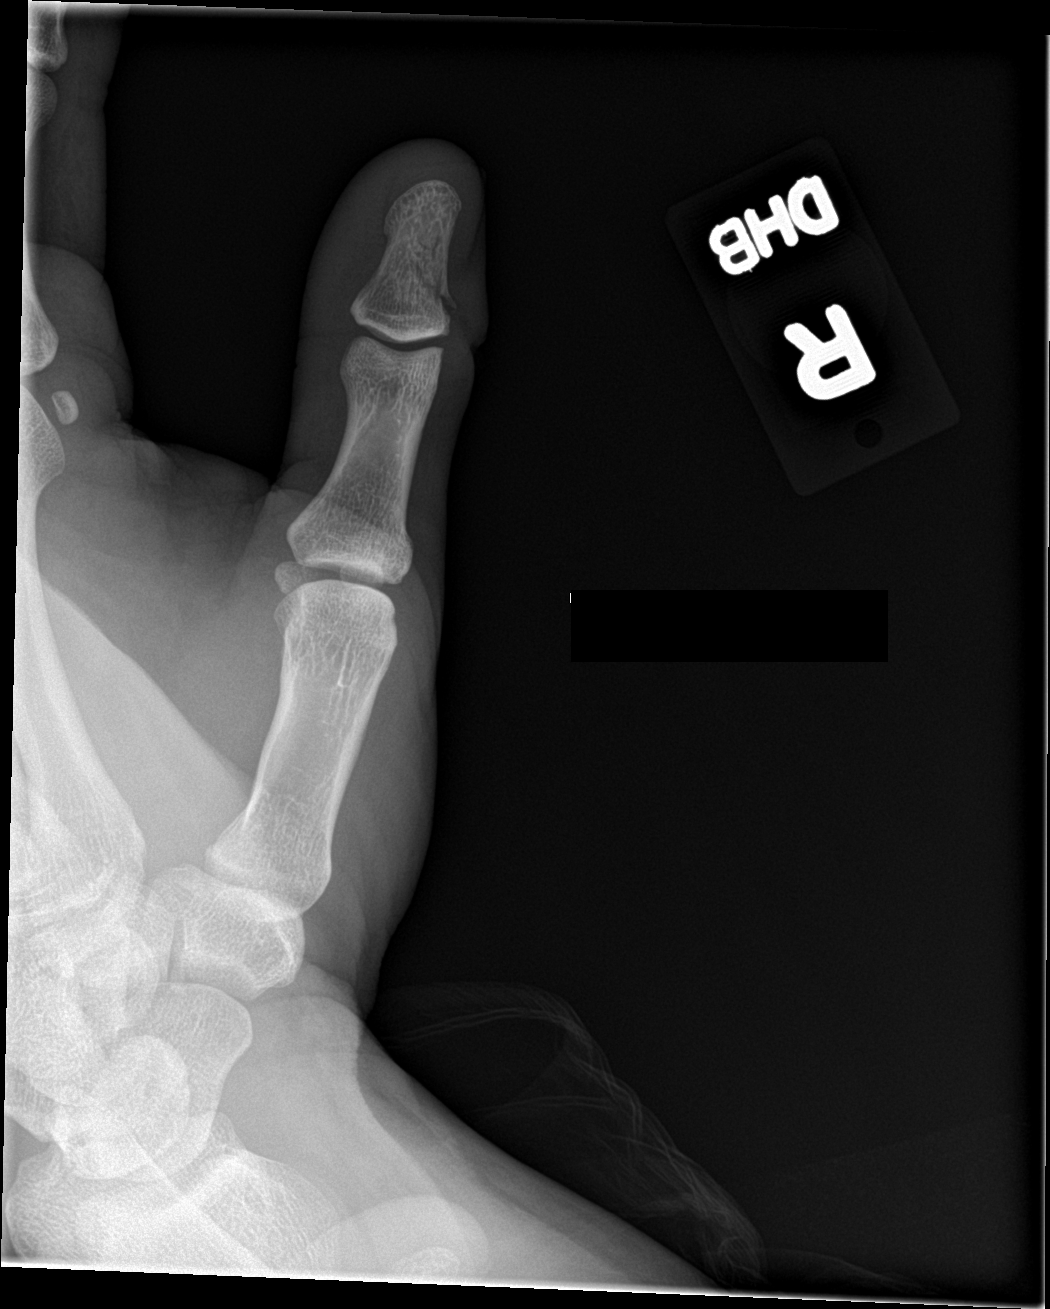

[finger obl]
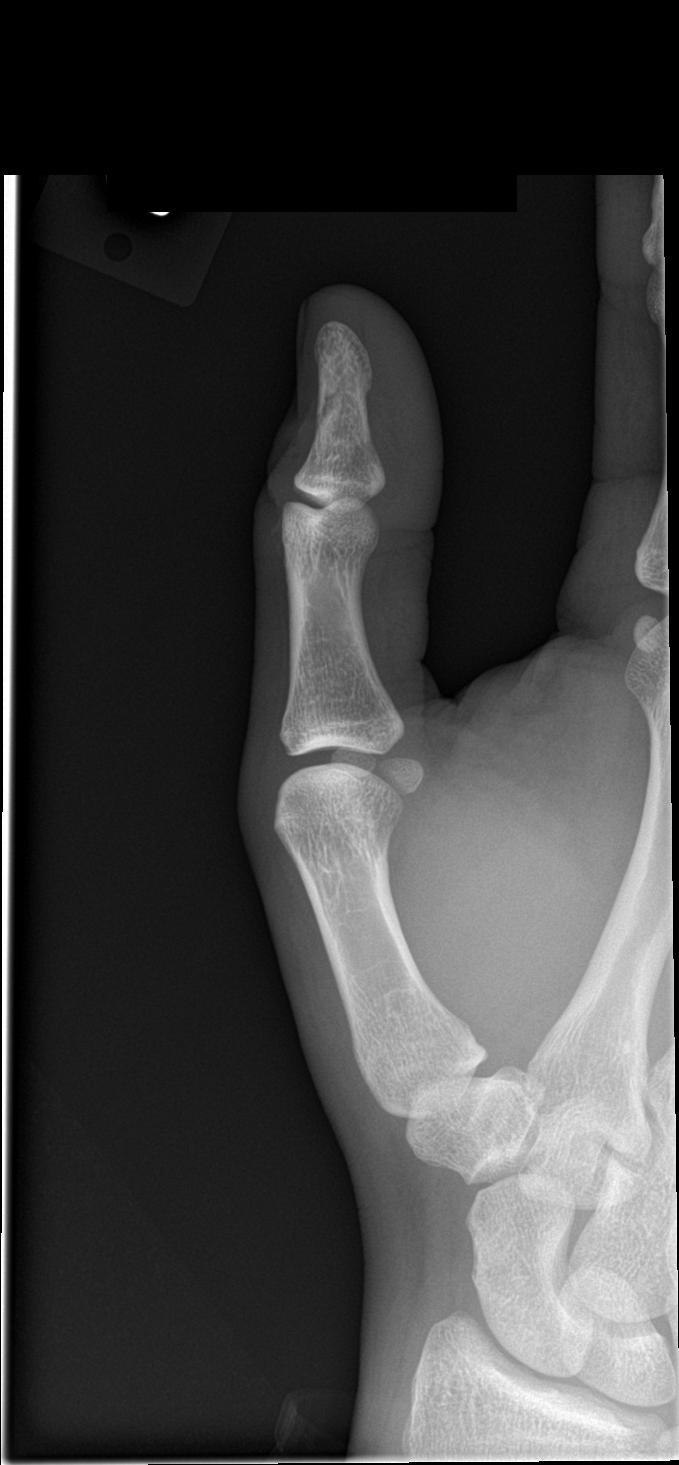

[finger lat]
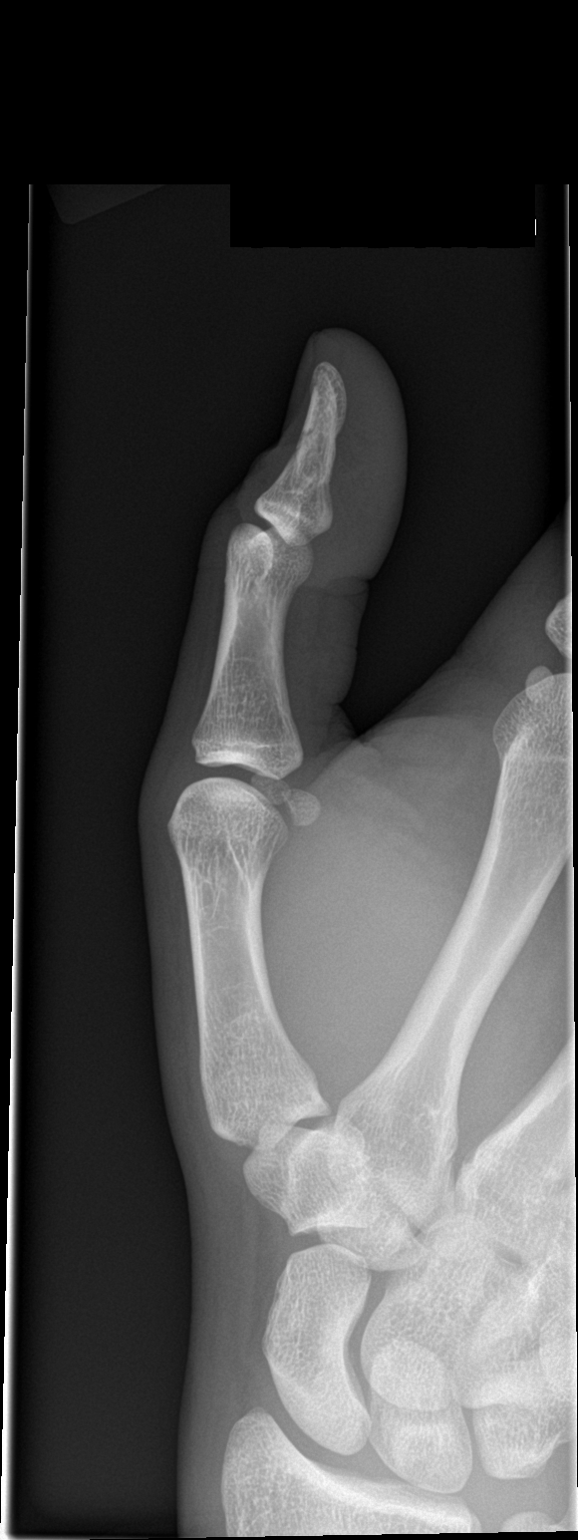

[3 of 3 positions shown; findings below may reference images not displayed]

FINDINGS: There is a nondisplaced, partial appearing, fracture of the distal
phalanx of the thumb. The fracture is contiguous with the skin
laceration. No other acute fracture identified. There is no
dislocation. The bones are well mineralized. There is soft tissue
swelling of the thumb. No radiopaque foreign object identified.
IMPRESSION: Nondisplaced fracture of the distal phalanx of the thumb.
